# Patient Record
Sex: Male | Born: 1990 | Race: White | Hispanic: No | Marital: Single | Smoking: Never smoker
Health system: Southern US, Community
[De-identification: ages and names within clinical notes are randomized; demographics above are authoritative.]

## PROBLEM LIST (undated history)

## (undated) DIAGNOSIS — J039 Acute tonsillitis, unspecified: Secondary | ICD-10-CM

## (undated) DIAGNOSIS — M25579 Pain in unspecified ankle and joints of unspecified foot: Secondary | ICD-10-CM

## (undated) DIAGNOSIS — G43909 Migraine, unspecified, not intractable, without status migrainosus: Secondary | ICD-10-CM

## (undated) HISTORY — DX: Acute tonsillitis, unspecified: J03.90

## (undated) HISTORY — DX: Migraine, unspecified, not intractable, without status migrainosus: G43.909

## (undated) HISTORY — DX: Pain in unspecified ankle and joints of unspecified foot: M25.579

---

## 2011-12-14 ENCOUNTER — Ambulatory Visit: Payer: Self-pay | Admitting: Family Medicine

## 2013-06-03 IMAGING — CR RIGHT ANKLE - COMPLETE 3+ VIEW
1 series · 5 of 5 positions shown · non-contrast
Comparison: none

REASON FOR EXAM: right ankle pain
COMMENTS:

PROCEDURE:     KDR - KDXR ANKLE RIGHT COMPLETE  - December 14, 2011  [DATE]
RESULT:     No fracture, dislocation or other acute bony abnormality is
identified. The ankle mortise is well maintained.

[Series 1: ap · 0.17mm/px · 5 of 5 slices shown]
[im 1/5]
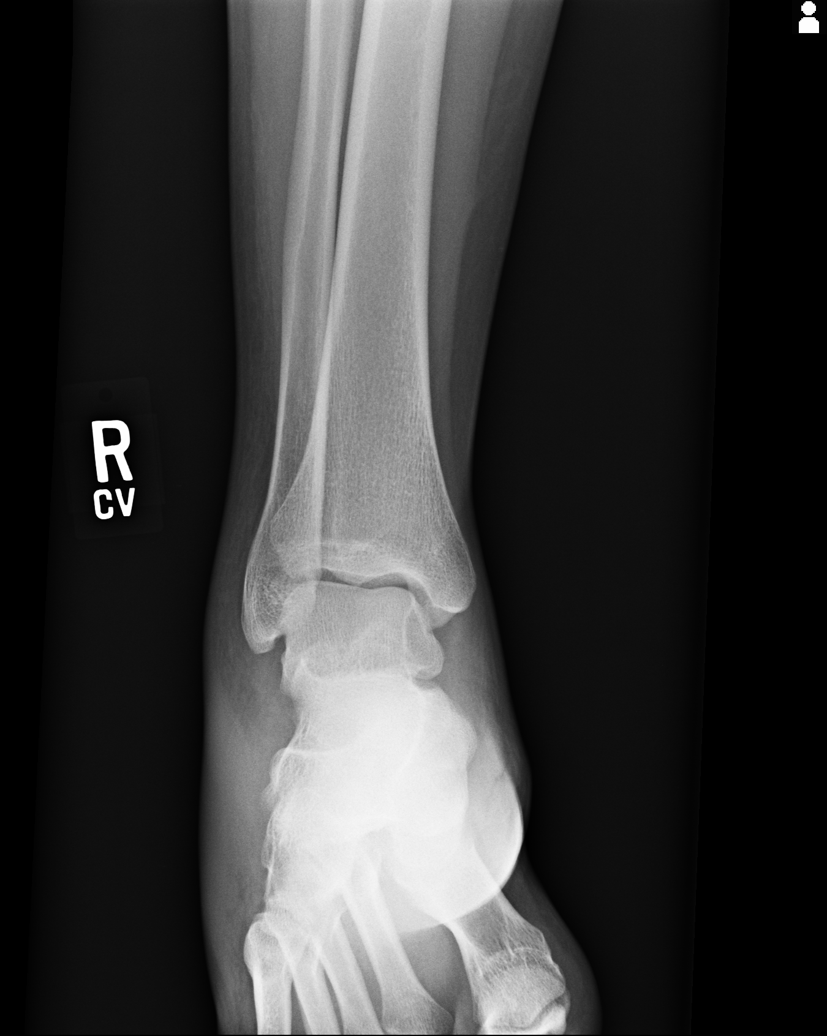
[im 2/5]
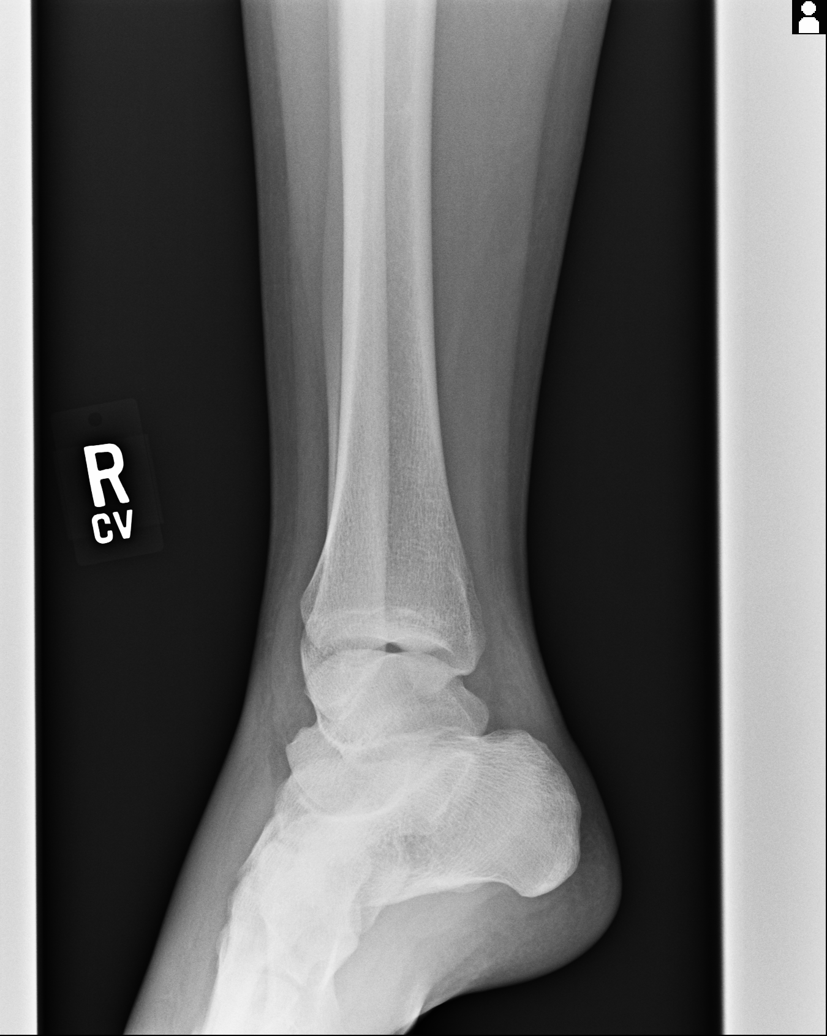
[im 3/5]
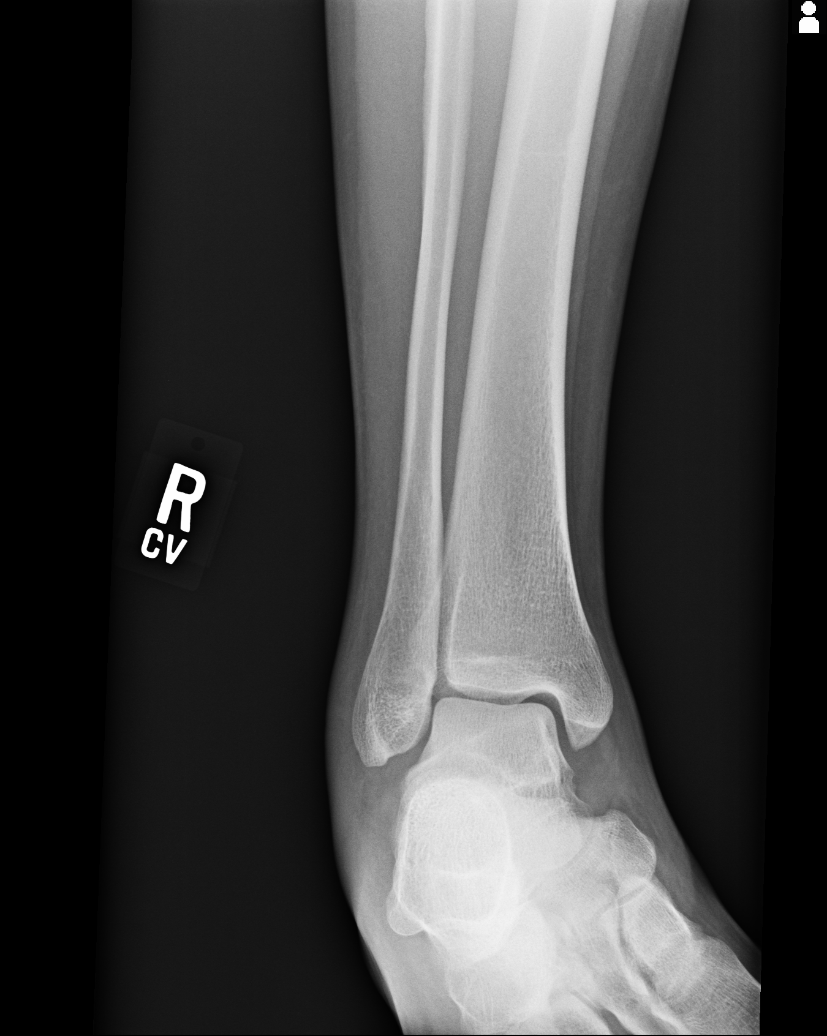
[im 4/5]
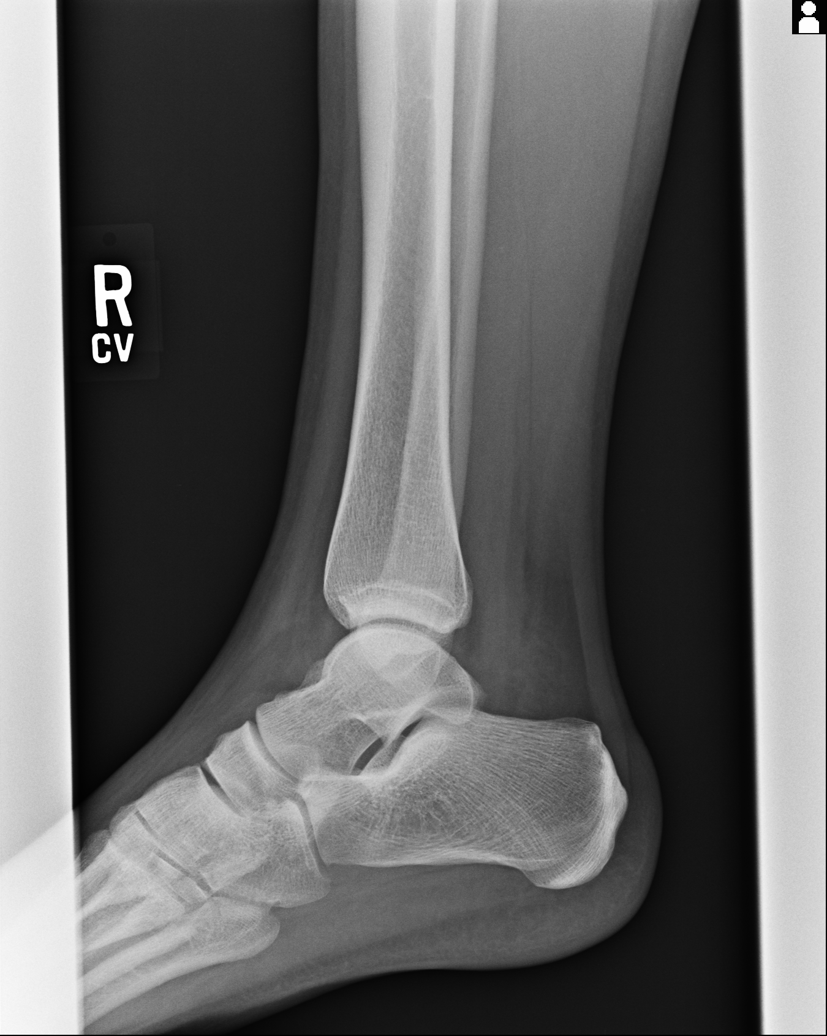
[im 5/5]
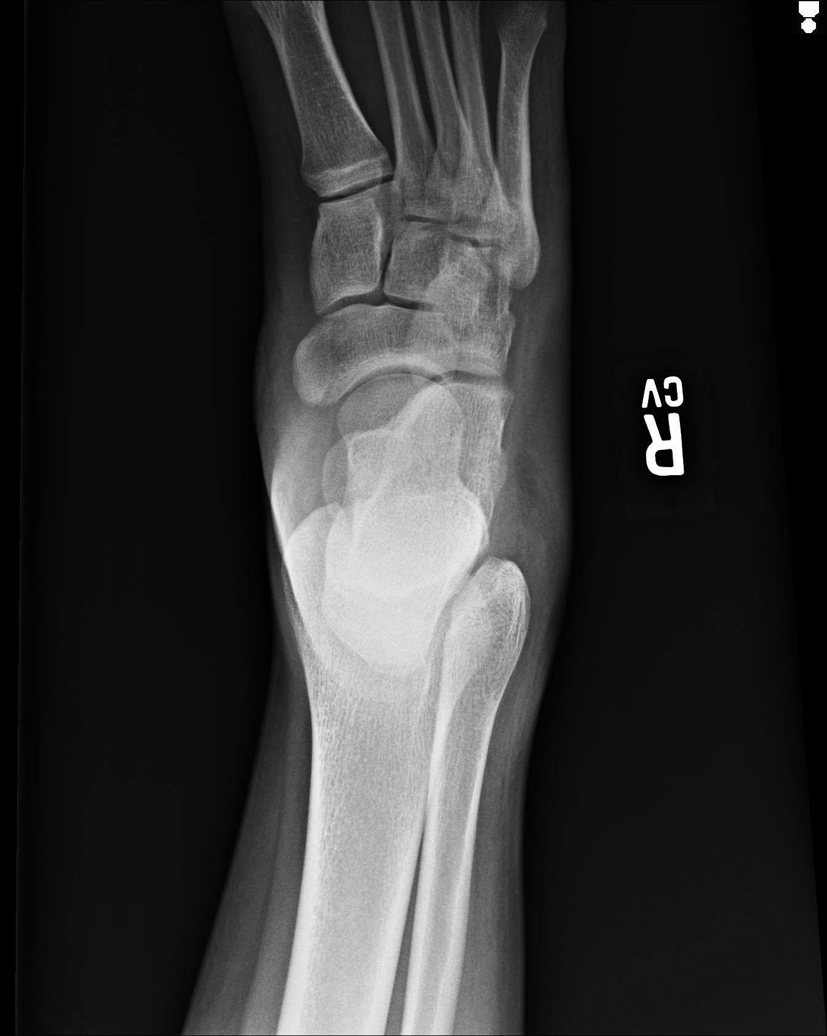

[5 of 5 positions shown; findings below may reference images not displayed]

IMPRESSION: No significant abnormalities are noted.

## 2014-05-18 ENCOUNTER — Emergency Department: Payer: Self-pay | Admitting: Internal Medicine

## 2014-05-18 LAB — CBC WITH DIFFERENTIAL/PLATELET
BASOS PCT: 0.3 %
Basophil #: 0 10*3/uL (ref 0.0–0.1)
EOS ABS: 0 10*3/uL (ref 0.0–0.7)
Eosinophil %: 0.5 %
HCT: 43.2 % (ref 40.0–52.0)
HGB: 14.5 g/dL (ref 13.0–18.0)
Lymphocyte #: 1.5 10*3/uL (ref 1.0–3.6)
Lymphocyte %: 17.2 %
MCH: 32.3 pg (ref 26.0–34.0)
MCHC: 33.6 g/dL (ref 32.0–36.0)
MCV: 96 fL (ref 80–100)
Monocyte #: 0.9 x10 3/mm (ref 0.2–1.0)
Monocyte %: 10.8 %
NEUTROS ABS: 6 10*3/uL (ref 1.4–6.5)
Neutrophil %: 71.2 %
Platelet: 222 10*3/uL (ref 150–440)
RBC: 4.49 10*6/uL (ref 4.40–5.90)
RDW: 11.6 % (ref 11.5–14.5)
WBC: 8.4 10*3/uL (ref 3.8–10.6)

## 2014-05-18 LAB — MONONUCLEOSIS SCREEN: MONO TEST: NEGATIVE

## 2014-05-21 LAB — BETA STREP CULTURE(ARMC)

## 2015-03-09 ENCOUNTER — Encounter: Payer: Self-pay | Admitting: Family Medicine

## 2015-03-09 ENCOUNTER — Ambulatory Visit (INDEPENDENT_AMBULATORY_CARE_PROVIDER_SITE_OTHER): Payer: 59 | Admitting: Family Medicine

## 2015-03-09 VITALS — BP 110/64 | HR 63 | Temp 97.8°F | Resp 16 | Ht 77.0 in | Wt 221.4 lb

## 2015-03-09 DIAGNOSIS — L259 Unspecified contact dermatitis, unspecified cause: Secondary | ICD-10-CM | POA: Diagnosis not present

## 2015-03-09 MED ORDER — METHYLPREDNISOLONE ACETATE 80 MG/ML IJ SUSP: 80.0000 mg | Freq: Once | INTRAMUSCULAR | Status: DC

## 2015-03-09 MED ORDER — METHYLPREDNISOLONE ACETATE 80 MG/ML IJ SUSP
80.0000 mg | Freq: Once | INTRAMUSCULAR | Status: AC
  Administered 2015-03-09: 80 mg via INTRAMUSCULAR

## 2015-03-09 MED ORDER — PREDNISONE 20 MG PO TABS: ORAL_TABLET | ORAL | Status: DC

## 2015-03-09 NOTE — Progress Notes (Signed)
Subjective:     Patient ID: Mark Daniels, male   DOB: 17-Mar-1991, 24 y.o.   MRN: [id number]  HPI  Chief Complaint  Patient presents with  . Rash    patient comes in office today with concerns of having poison oak on upper extremity and spreading to lower extremity. 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   Has been applying Caladryl. Now has facial/ear involvement.   Review of Systems  Constitutional: Negative for fever and chills.       Objective:   Physical Exam  Constitutional: He appears well-developed and well-nourished. No distress.  Skin:  Patches of rash with small overlying vesicles on his bilateral face, ear, and stomach. Linear distribution noted on his left upper arm       Assessment:     1. Contact dermatitis - predniSONE (DELTASONE) 20 MG tablet; Taper as follows: 3 pills x 4 days, 2 pills for 4 days, one pill for four days  Dispense: 24 tablet; Refill: 0    Plan:    Depomedrol 80 mg. IM

## 2015-03-09 NOTE — Patient Instructions (Signed)
Cleanse with soap and water daily 

## 2015-05-14 ENCOUNTER — Ambulatory Visit (INDEPENDENT_AMBULATORY_CARE_PROVIDER_SITE_OTHER): Payer: 59 | Admitting: Family Medicine

## 2015-05-14 ENCOUNTER — Encounter: Payer: Self-pay | Admitting: Family Medicine

## 2015-05-14 VITALS — BP 104/58 | HR 86 | Temp 98.3°F | Resp 16 | Wt 230.4 lb

## 2015-05-14 DIAGNOSIS — J039 Acute tonsillitis, unspecified: Secondary | ICD-10-CM | POA: Insufficient documentation

## 2015-05-14 DIAGNOSIS — G43909 Migraine, unspecified, not intractable, without status migrainosus: Secondary | ICD-10-CM | POA: Insufficient documentation

## 2015-05-14 DIAGNOSIS — S39012A Strain of muscle, fascia and tendon of lower back, initial encounter: Secondary | ICD-10-CM

## 2015-05-14 DIAGNOSIS — M25579 Pain in unspecified ankle and joints of unspecified foot: Secondary | ICD-10-CM | POA: Insufficient documentation

## 2015-05-14 MED ORDER — CYCLOBENZAPRINE HCL 5 MG PO TABS: 5.0000 mg | ORAL_TABLET | Freq: Three times a day (TID) | ORAL | Status: DC | PRN

## 2015-05-14 MED ORDER — HYDROCODONE-ACETAMINOPHEN 5-325 MG PO TABS: ORAL_TABLET | ORAL | Status: DC

## 2015-05-14 NOTE — Patient Instructions (Signed)
Discussed use of ibuprofen 400 to 800 mg. 3 x day with food. 

## 2015-05-14 NOTE — Progress Notes (Signed)
Subjective:     Patient ID: Mark Daniels, male   DOB: [birth year], 24 y.o.   MRN: [id number]  HPI  Chief Complaint  Patient presents with  . Back Pain    lower back pain, started at the bennining of July. Saw a chiropracter twice two weeks ago. have been taking ibuprofen for pain.  States he originally felt back pain when he was getting off the couch. 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 He had to take time off work with some improvement using an occasional nsaid for relief. Now back pain and spasms have flared. He has had two chiropractic treatments with minimal improvement. Denies radicular sx or prior back surgery. He is accompanied by his wife today.   Review of Systems  Constitutional: Negative for fever and chills.       Objective:   Physical Exam  Musculoskeletal:  Muscle strength in lower extremities 5/5. SLR's to 80 degrees without radiation of back pain. Localizes to his bilateral lumbar back. Wearing an ice pack with minimal tenderness to touch.       Assessment:    1. Low back strain, initial encounter - cyclobenzaprine (FLEXERIL) 5 MG tablet; Take 1 tablet (5 mg total) by mouth 3 (three) times daily as needed for muscle spasms.  Dispense: 21 tablet; Refill: 1 - HYDROcodone-acetaminophen (NORCO/VICODIN) 5-325 MG per tablet; One every 4-6 hours as needed for pain  Dispense: 28 tablet; Refill: 0    Plan:    Discussed scheduling nsaid's. Work excuse for 8/25-9/1.

## 2016-03-04 ENCOUNTER — Encounter: Payer: Self-pay | Admitting: Physician Assistant

## 2016-03-04 ENCOUNTER — Ambulatory Visit (INDEPENDENT_AMBULATORY_CARE_PROVIDER_SITE_OTHER): Payer: Self-pay | Admitting: Physician Assistant

## 2016-03-04 VITALS — BP 120/68 | HR 76 | Temp 98.1°F | Resp 16 | Wt 215.0 lb

## 2016-03-04 DIAGNOSIS — L237 Allergic contact dermatitis due to plants, except food: Secondary | ICD-10-CM

## 2016-03-04 MED ORDER — TRIAMCINOLONE ACETONIDE 0.1 % EX CREA: 1.0000 "application " | TOPICAL_CREAM | Freq: Two times a day (BID) | CUTANEOUS | Status: DC

## 2016-03-04 MED ORDER — PREDNISONE 10 MG (21) PO TBPK: ORAL_TABLET | ORAL | Status: DC

## 2016-03-04 NOTE — Progress Notes (Signed)
       Patient: Mark Daniels Male    DOB: Nov 22, 1990   25 y.o.   MRN: [id number] Visit Date: 03/04/2016  Today's Provider: Margaretann LovelessJennifer M Avrie Kedzierski, PA-C   Chief Complaint  Patient presents with  . Rash   Subjective:    Rash This is a new problem. The current episode started in the past 7 days (On Monday). The problem has been gradually worsening since onset. The rash is diffuse. The rash is characterized by blistering, redness, itchiness and burning. He was exposed to plant contact (works in [employer] and all workers got into it). Pertinent negatives include no congestion, cough, diarrhea, eye pain, facial edema, fatigue, fever, joint pain, shortness of breath or sore throat. Past treatments include anti-itch cream (Calamine). The treatment provided no relief.       No Known Allergies No outpatient prescriptions have been marked as taking for the 03/04/16 encounter (Office Visit) with Margaretann LovelessJennifer M Zina Pitzer, PA-C.    Review of Systems  Constitutional: Negative for fever and fatigue.  HENT: Negative for congestion and sore throat.   Eyes: Negative for pain.  Respiratory: Negative for cough and shortness of breath.   Cardiovascular: Negative for chest pain, palpitations and leg swelling.  Gastrointestinal: Negative for diarrhea.  Musculoskeletal: Negative for joint pain.  Skin: Positive for rash.    Social History  Substance Use Topics  . Smoking status: Never Smoker   . Smokeless tobacco: Former NeurosurgeonUser    Types: Chew    Quit date: 02/17/2014  . Alcohol Use: No   Objective:   BP 120/68 mmHg  Pulse 76  Temp(Src) 98.1 F (36.7 C) (Oral)  Resp 16  Wt 215 lb (97.523 kg)  Physical Exam  Constitutional: He appears well-developed and well-nourished. No distress.  HENT:  Head: Normocephalic and atraumatic.  Neck: Normal range of motion. Neck supple.  Cardiovascular: Normal rate, regular rhythm and normal heart sounds.  Exam reveals no gallop and no friction rub.   No murmur  heard. Pulmonary/Chest: Effort normal and breath sounds normal. No respiratory distress. He has no wheezes. He has no rales.  Lymphadenopathy:    He has no cervical adenopathy.  Skin: Rash noted. Rash is vesicular. He is not diaphoretic.  Diffuse vesicular rash that is pruritic. Not currently draining.   Vitals reviewed.       Assessment & Plan:     1. Poison oak dermatitis Will treat with 6-day prednisone taper and triamcinolone. May continue calamine lotion as well. Call if symptoms fail to improve.  - predniSONE (STERAPRED UNI-PAK 21 TAB) 10 MG (21) TBPK tablet; Take as directed on package instructions  Dispense: 21 tablet; Refill: 0 - triamcinolone cream (KENALOG) 0.1 %; Apply 1 application topically 2 (two) times daily.  Dispense: 30 g; Refill: 0       Margaretann LovelessJennifer M Colan Laymon, PA-C  [facility]

## 2016-03-04 NOTE — Patient Instructions (Signed)
Poison Oak Poison oak is an inflammation of the skin (contact dermatitis). It is caused by contact with the allergens on the leaves of the oak (toxicodendron) plants. Depending on your sensitivity, the rash may consist simply of redness and itching, or it may also progress to blisters which may break open (rupture). These must be well cared for to prevent secondary germ (bacterial) infection as these infections can lead to scarring. The eyes may also get puffy. The puffiness is worst in the morning and gets better as the day progresses. Healing is best accomplished by keeping any open areas dry, clean, covered with a bandage, and covered with an antibacterial ointment if needed. Without secondary infection, this dermatitis usually heals without scarring within 2 to 3 weeks without treatment. HOME CARE INSTRUCTIONS When you have been exposed to poison oak, it is very important to thoroughly wash with soap and water as soon as the exposure has been discovered. You have about one half hour to remove the plant resin before it will cause the rash. This cleaning will quickly destroy the oil or antigen on the skin (the antigen is what causes the rash). Wash aggressively under the fingernails as any plant resin still there will continue to spread the rash. Do not rub skin vigorously when washing affected area. Poison oak cannot spread if no oil from the plant remains on your body. Rash that has progressed to weeping sores (lesions) will not spread the rash unless you have not washed thoroughly. It is also important to clean any clothes you have been wearing as they may carry active allergens which will spread the rash, even several days later. Avoidance of the plant in the future is the best measure. Poison oak plants can be recognized by the number of leaves. Generally, poison oak has three leaves with flowering branches on a single stem. Diphenhydramine may be purchased over the counter and used as needed for  itching. Do not drive with this medication if it makes you drowsy. Ask your caregiver about medication for children. SEEK IMMEDIATE MEDICAL CARE IF:   Open areas of the rash develop.  You notice redness extending beyond the area of the rash.  There is a pus like discharge.  There is increased pain.  Other signs of infection develop (such as fever).   This information is not intended to replace advice given to you by your health care provider. Make sure you discuss any questions you have with your health care provider.   Document Released: 03/12/2003 Document Revised: 11/28/2011 Document Reviewed: 02/11/2015 Elsevier Interactive Patient Education 2016 Elsevier Inc.  

## 2019-08-28 ENCOUNTER — Other Ambulatory Visit: Payer: Self-pay | Admitting: *Deleted

## 2019-08-28 DIAGNOSIS — Z20822 Contact with and (suspected) exposure to covid-19: Secondary | ICD-10-CM

## 2019-08-30 LAB — NOVEL CORONAVIRUS, NAA: SARS-CoV-2, NAA: NOT DETECTED

## 2020-04-23 ENCOUNTER — Ambulatory Visit: Payer: Self-pay

## 2020-04-23 ENCOUNTER — Other Ambulatory Visit: Payer: Self-pay

## 2020-04-23 DIAGNOSIS — Z0000 Encounter for general adult medical examination without abnormal findings: Secondary | ICD-10-CM

## 2020-04-23 NOTE — Addendum Note (Signed)
Addended by: Christianne Dolin F on: 04/23/2020 09:16 AM   Modules accepted: Orders

## 2020-04-24 LAB — CMP12+LP+TP+TSH+6AC+CBC/D/PLT
ALT: 19 IU/L (ref 0–44)
AST: 20 IU/L (ref 0–40)
Albumin/Globulin Ratio: 2.5 — ABNORMAL HIGH (ref 1.2–2.2)
Albumin: 4.7 g/dL (ref 4.1–5.2)
Alkaline Phosphatase: 49 IU/L (ref 48–121)
BUN/Creatinine Ratio: 13 (ref 9–20)
BUN: 13 mg/dL (ref 6–20)
Basophils Absolute: 0 10*3/uL (ref 0.0–0.2)
Basos: 1 %
Bilirubin Total: 1 mg/dL (ref 0.0–1.2)
Calcium: 9.2 mg/dL (ref 8.7–10.2)
Chloride: 102 mmol/L (ref 96–106)
Chol/HDL Ratio: 2.4 ratio (ref 0.0–5.0)
Cholesterol, Total: 120 mg/dL (ref 100–199)
Creatinine, Ser: 0.98 mg/dL (ref 0.76–1.27)
EOS (ABSOLUTE): 0.1 10*3/uL (ref 0.0–0.4)
Eos: 3 %
Estimated CHD Risk: <0.5 times avg. (ref 0.0–1.0)
Free Thyroxine Index: 2.5 (ref 1.2–4.9)
GFR calc Af Amer: 122 mL/min/{1.73_m2} (ref >59)
GFR calc non Af Amer: 105 mL/min/{1.73_m2} (ref >59)
GGT: 14 IU/L (ref 0–65)
Globulin, Total: 1.9 g/dL (ref 1.5–4.5)
Glucose: 92 mg/dL (ref 65–99)
HDL: 49 mg/dL (ref >39)
Hematocrit: 42.8 % (ref 37.5–51.0)
Hemoglobin: 14.9 g/dL (ref 13.0–17.7)
Immature Grans (Abs): 0 10*3/uL (ref 0.0–0.1)
Immature Granulocytes: 1 %
Iron: 94 ug/dL (ref 38–169)
LDH: 170 IU/L (ref 121–224)
LDL Chol Calc (NIH): 61 mg/dL (ref 0–99)
Lymphocytes Absolute: 1.7 10*3/uL (ref 0.7–3.1)
Lymphs: 36 %
MCH: 32.4 pg (ref 26.6–33.0)
MCHC: 34.8 g/dL (ref 31.5–35.7)
MCV: 93 fL (ref 79–97)
Monocytes Absolute: 0.4 10*3/uL (ref 0.1–0.9)
Monocytes: 8 %
Neutrophils Absolute: 2.4 10*3/uL (ref 1.4–7.0)
Neutrophils: 51 %
Phosphorus: 2.8 mg/dL (ref 2.8–4.1)
Platelets: 287 10*3/uL (ref 150–450)
Potassium: 3.6 mmol/L (ref 3.5–5.2)
RBC: 4.6 x10E6/uL (ref 4.14–5.80)
RDW: 12 % (ref 11.6–15.4)
Sodium: 139 mmol/L (ref 134–144)
T3 Uptake Ratio: 33 % (ref 24–39)
T4, Total: 7.6 ug/dL (ref 4.5–12.0)
TSH: 0.981 u[IU]/mL (ref 0.450–4.500)
Total Protein: 6.6 g/dL (ref 6.0–8.5)
Triglycerides: 40 mg/dL (ref 0–149)
Uric Acid: 6.7 mg/dL (ref 3.8–8.4)
VLDL Cholesterol Cal: 10 mg/dL (ref 5–40)
WBC: 4.6 10*3/uL (ref 3.4–10.8)

## 2020-04-30 ENCOUNTER — Ambulatory Visit: Payer: Self-pay | Admitting: Emergency Medicine

## 2020-04-30 ENCOUNTER — Other Ambulatory Visit: Payer: Self-pay

## 2020-04-30 VITALS — BP 134/68 | HR 81 | Temp 98.8°F

## 2020-04-30 DIAGNOSIS — Z0000 Encounter for general adult medical examination without abnormal findings: Secondary | ICD-10-CM

## 2020-04-30 LAB — POCT URINALYSIS DIPSTICK
Bilirubin, UA: NEGATIVE
Blood, UA: NEGATIVE
Glucose, UA: NEGATIVE
Ketones, UA: NEGATIVE
Leukocytes, UA: NEGATIVE
Nitrite, UA: NEGATIVE
Protein, UA: NEGATIVE
Spec Grav, UA: 1.01 (ref 1.010–1.025)
Urobilinogen, UA: 0.2 E.U./dL
pH, UA: 7 (ref 5.0–8.0)

## 2020-04-30 NOTE — Addendum Note (Signed)
Addended by: Christianne Dolin F on: 04/30/2020 04:19 PM   Modules accepted: Orders

## 2020-04-30 NOTE — Progress Notes (Signed)
Hearing Screening completed and placed on paper chart. Occupational Health Provider Note       Time seen: 11:19 AM    I have reviewed the vital signs and the nursing notes.  HISTORY   Chief Complaint No chief complaint on file.    HPI Mark Daniels is a 29 y.o. male with a history of migraines, tonsillitis who presents today for annual physical examination.  Patient denies any specific complaints, occasionally has some low back pain but not consistently.  Past Medical History:  Diagnosis Date  . Ankle pain right  . Migraine   . Tonsillitis     No past surgical history on file.  Allergies Patient has no known allergies.   Review of Systems Constitutional: Negative for fever. Cardiovascular: Negative for chest pain. Respiratory: Negative for shortness of breath. Gastrointestinal: Negative for abdominal pain, vomiting and diarrhea. Musculoskeletal: Positive occasional low back pain Skin: Negative for rash. Neurological: Negative for headaches, focal weakness or numbness.  All systems negative/normal/unremarkable except as stated in the HPI  ____________________________________________   PHYSICAL EXAM:  VITAL SIGNS: Vitals:   04/30/20 1137  BP: 134/68  Pulse: 81  Temp: 98.8 F (37.1 C)  SpO2: 99%    Constitutional: Alert and oriented. Well appearing and in no distress. Eyes: Conjunctivae are normal. Normal extraocular movements. ENT      Head: Normocephalic and atraumatic.      Nose: No congestion/rhinnorhea.      Mouth/Throat: Mucous membranes are moist.      Neck: No stridor. Cardiovascular: Normal rate, regular rhythm. No murmurs, rubs, or gallops. Respiratory: Normal respiratory effort without tachypnea nor retractions. Breath sounds are clear and equal bilaterally. No wheezes/rales/rhonchi. Gastrointestinal: Soft and nontender. Normal bowel sounds Musculoskeletal: Nontender with normal range of motion in extremities. No lower extremity  tenderness nor edema. Neurologic:  Normal speech and language. No gross focal neurologic deficits are appreciated.  Skin:  Skin is warm, dry and intact. No rash noted. Psychiatric: Speech and behavior are normal.   ____________________________________________   LABS (pertinent positives/negatives)  Recent Results (from the past 2160 hour(s))  CMP12+LP+TP+TSH+6AC+CBC/D/Plt     Status: Abnormal   Collection Time: 04/23/20  9:00 AM  Result Value Ref Range   Glucose 92 65 - 99 mg/dL   Uric Acid 6.7 3.8 - 8.4 mg/dL    Comment:            Therapeutic target for gout patients: <6.0   BUN 13 6 - 20 mg/dL   Creatinine, Ser 0.98 0.76 - 1.27 mg/dL   GFR calc non Af Amer 105 >59 mL/min/1.73   GFR calc Af Amer 122 >59 mL/min/1.73    Comment: **[facility] currently reports eGFR in compliance with the current**   recommendations of the [employer]. [facility] will   update reporting as new guidelines are published from the NKF-ASN   Task force.    BUN/Creatinine Ratio 13 9 - 20   Sodium 139 134 - 144 mmol/L   Potassium 3.6 3.5 - 5.2 mmol/L   Chloride 102 96 - 106 mmol/L   Calcium 9.2 8.7 - 10.2 mg/dL   Phosphorus 2.8 2.8 - 4.1 mg/dL   Total Protein 6.6 6.0 - 8.5 g/dL   Albumin 4.7 4.1 - 5.2 g/dL   Globulin, Total 1.9 1.5 - 4.5 g/dL   Albumin/Globulin Ratio 2.5 (H) 1.2 - 2.2   Bilirubin Total 1.0 0.0 - 1.2 mg/dL   Alkaline Phosphatase 49 48 - 121 IU/L   LDH 170  121 - 224 IU/L   AST 20 0 - 40 IU/L   ALT 19 0 - 44 IU/L   GGT 14 0 - 65 IU/L   Iron 94 38 - 169 ug/dL   Cholesterol, Total 120 100 - 199 mg/dL   Triglycerides 40 0 - 149 mg/dL   HDL 49 >39 mg/dL   VLDL Cholesterol Cal 10 5 - 40 mg/dL   LDL Chol Calc (NIH) 61 0 - 99 mg/dL   Chol/HDL Ratio 2.4 0.0 - 5.0 ratio    Comment:                                   T. Chol/HDL Ratio                                             Men  Women                               1/2 Avg.Risk  3.4    3.3                                    Avg.Risk  5.0    4.4                                2X Avg.Risk  9.6    7.1                                3X Avg.Risk 23.4   11.0    Estimated CHD Risk  < 0.5 0.0 - 1.0 times avg.    Comment: The CHD Risk is based on the T. Chol/HDL ratio. Other factors affect CHD Risk such as hypertension, smoking, diabetes, severe obesity, and family history of premature CHD.    TSH 0.981 0.450 - 4.500 uIU/mL   T4, Total 7.6 4.5 - 12.0 ug/dL   T3 Uptake Ratio 33 24 - 39 %   Free Thyroxine Index 2.5 1.2 - 4.9   WBC 4.6 3.4 - 10.8 x10E3/uL   RBC 4.60 4.14 - 5.80 x10E6/uL   Hemoglobin 14.9 13.0 - 17.7 g/dL   Hematocrit 42.8 37.5 - 51.0 %   MCV 93 79 - 97 fL   MCH 32.4 26.6 - 33.0 pg   MCHC 34.8 31 - 35 g/dL   RDW 12.0 11.6 - 15.4 %   Platelets 287 150 - 450 x10E3/uL   Neutrophils 51 Not Estab. %   Lymphs 36 Not Estab. %   Monocytes 8 Not Estab. %   Eos 3 Not Estab. %   Basos 1 Not Estab. %   Neutrophils Absolute 2.4 1 - 7 x10E3/uL   Lymphocytes Absolute 1.7 0 - 3 x10E3/uL   Monocytes Absolute 0.4 0 - 0 x10E3/uL   EOS (ABSOLUTE) 0.1 0.0 - 0.4 x10E3/uL   Basophils Absolute 0.0 0 - 0 x10E3/uL   Immature Granulocytes 1 Not Estab. %   Immature Grans (Abs) 0.0 0.0 - 0.1 x10E3/uL     ASSESSMENT AND PLAN  Annual physical examination   Plan:  The patient had presented for an annual physical examination. Patient's labs were reassuring.  He is cleared follow-up as needed  Lenise Arena MD    Note: This note was generated in part or whole with voice recognition software. Voice recognition is usually quite accurate but there are transcription errors that can and very often do occur. I apologize for any typographical errors that were not detected and corrected.

## 2020-04-30 NOTE — Addendum Note (Signed)
Addended by: Oda Cogan on: 04/30/2020 01:14 PM   Modules accepted: Orders

## 2020-06-30 ENCOUNTER — Other Ambulatory Visit: Payer: Self-pay

## 2020-09-22 ENCOUNTER — Other Ambulatory Visit: Payer: Self-pay

## 2020-09-22 ENCOUNTER — Ambulatory Visit: Payer: Self-pay

## 2020-09-22 DIAGNOSIS — Z0000 Encounter for general adult medical examination without abnormal findings: Secondary | ICD-10-CM

## 2020-09-22 LAB — POCT URINALYSIS DIPSTICK
Bilirubin, UA: NEGATIVE
Blood, UA: NEGATIVE
Glucose, UA: NEGATIVE
Ketones, UA: NEGATIVE
Leukocytes, UA: NEGATIVE
Nitrite, UA: NEGATIVE
Protein, UA: NEGATIVE
Spec Grav, UA: <=1.005 — AB (ref 1.010–1.025)
Urobilinogen, UA: 0.2 E.U./dL
pH, UA: 6 (ref 5.0–8.0)

## 2020-09-22 NOTE — Progress Notes (Signed)
Pt scheduled to complete physical with Durward Parcel, PA-C 09/29/20

## 2020-09-23 LAB — CMP12+LP+TP+TSH+6AC+CBC/D/PLT
ALT: 10 IU/L (ref 0–44)
AST: 14 IU/L (ref 0–40)
Albumin/Globulin Ratio: 2.3 — ABNORMAL HIGH (ref 1.2–2.2)
Albumin: 5 g/dL (ref 4.1–5.2)
Alkaline Phosphatase: 54 IU/L (ref 44–121)
BUN/Creatinine Ratio: 15 (ref 9–20)
BUN: 15 mg/dL (ref 6–20)
Basophils Absolute: 0 10*3/uL (ref 0.0–0.2)
Basos: 1 %
Bilirubin Total: 0.4 mg/dL (ref 0.0–1.2)
Calcium: 9.4 mg/dL (ref 8.7–10.2)
Chloride: 101 mmol/L (ref 96–106)
Chol/HDL Ratio: 2.9 ratio (ref 0.0–5.0)
Cholesterol, Total: 139 mg/dL (ref 100–199)
Creatinine, Ser: 1 mg/dL (ref 0.76–1.27)
EOS (ABSOLUTE): 0.2 10*3/uL (ref 0.0–0.4)
Eos: 3 %
Estimated CHD Risk: <0.5 times avg. (ref 0.0–1.0)
Free Thyroxine Index: 2.1 (ref 1.2–4.9)
GFR calc Af Amer: 118 mL/min/{1.73_m2} (ref >59)
GFR calc non Af Amer: 102 mL/min/{1.73_m2} (ref >59)
GGT: 12 IU/L (ref 0–65)
Globulin, Total: 2.2 g/dL (ref 1.5–4.5)
Glucose: 78 mg/dL (ref 65–99)
HDL: 48 mg/dL (ref >39)
Hematocrit: 46.3 % (ref 37.5–51.0)
Hemoglobin: 16.2 g/dL (ref 13.0–17.7)
Immature Grans (Abs): 0.1 10*3/uL (ref 0.0–0.1)
Immature Granulocytes: 1 %
Iron: 66 ug/dL (ref 38–169)
LDH: 180 IU/L (ref 121–224)
LDL Chol Calc (NIH): 78 mg/dL (ref 0–99)
Lymphocytes Absolute: 2.6 10*3/uL (ref 0.7–3.1)
Lymphs: 42 %
MCH: 32.9 pg (ref 26.6–33.0)
MCHC: 35 g/dL (ref 31.5–35.7)
MCV: 94 fL (ref 79–97)
Monocytes Absolute: 0.4 10*3/uL (ref 0.1–0.9)
Monocytes: 7 %
Neutrophils Absolute: 2.9 10*3/uL (ref 1.4–7.0)
Neutrophils: 46 %
Phosphorus: 3 mg/dL (ref 2.8–4.1)
Platelets: 303 10*3/uL (ref 150–450)
Potassium: 3.7 mmol/L (ref 3.5–5.2)
RBC: 4.92 x10E6/uL (ref 4.14–5.80)
RDW: 11 % — ABNORMAL LOW (ref 11.6–15.4)
Sodium: 140 mmol/L (ref 134–144)
T3 Uptake Ratio: 29 % (ref 24–39)
T4, Total: 7.4 ug/dL (ref 4.5–12.0)
TSH: 1.63 u[IU]/mL (ref 0.450–4.500)
Total Protein: 7.2 g/dL (ref 6.0–8.5)
Triglycerides: 66 mg/dL (ref 0–149)
Uric Acid: 5.3 mg/dL (ref 3.8–8.4)
VLDL Cholesterol Cal: 13 mg/dL (ref 5–40)
WBC: 6.2 10*3/uL (ref 3.4–10.8)

## 2020-09-29 ENCOUNTER — Encounter: Payer: 59 | Admitting: Physician Assistant

## 2020-09-30 ENCOUNTER — Other Ambulatory Visit: Payer: Self-pay

## 2020-09-30 ENCOUNTER — Encounter: Payer: Self-pay | Admitting: Adult Medicine

## 2020-09-30 ENCOUNTER — Ambulatory Visit: Payer: Self-pay | Admitting: Adult Medicine

## 2020-09-30 VITALS — BP 125/68 | HR 76 | Resp 12 | Ht 77.0 in | Wt 187.0 lb

## 2020-09-30 DIAGNOSIS — Z0000 Encounter for general adult medical examination without abnormal findings: Secondary | ICD-10-CM

## 2020-09-30 DIAGNOSIS — G43009 Migraine without aura, not intractable, without status migrainosus: Secondary | ICD-10-CM

## 2020-09-30 NOTE — Progress Notes (Signed)
Subjective:    Patient ID: Mark Daniels, male    DOB: 1991/08/14, 30 y.o.   MRN: [id number]  HPI HPI Mark Daniels is a 30 y.o. male with a history of migraines, tonsillitis who presents today for annual physical examination.  Patient denies any specific complaints  Reports HA present since middle school  in last 5y occurs 1x q3-77massoc with nausea, photophbia and last 3hr with post HA effect up to24hr   Allergy +/- reaction to dust, pine needles  Denies these as temporally related triggers  Med  NSAID  Flexeril  Review of Systems  Noncontributory and unchanged since last annual exam     Objective:   Physical Exam  WF WM NAD  A&Ox3 nl affect HEENT  [place]/ AT  PERRLA with undilated funduscopic exam, Mouth Nose wnl Thyroid non palpable Pulm Clear to A&P Cardiac PMI nl NSR no murmer, ectopy Abd soft BS + no organ enlargement Extremity dominant rt hand, fROM, 5/5strength upper/lower  Neuro intact with antalgia  Component Ref Range & Units 8 d ago 5 mo ago 6 yr ago  Glucose 65 - 99 mg/dL 78  92    Uric Acid 3.8 - 8.4 mg/dL 5.3  6.7 CM    Comment:      Therapeutic target for gout patients: <6.0  BUN 6 - 20 mg/dL 15  13    Creatinine, Ser 0.76 - 1.27 mg/dL 1.00  0.98    GFR calc non Af Amer >59 mL/min/1.73 102  105    GFR calc Af Amer >59 mL/min/1.73 118  122 CM    Comment: **In accordance with recommendations from the NKF-ASN Task force,**   [facility] is in the process of updating its eGFR calculation to the   2021 CKD-EPI creatinine equation that estimates kidney function   without a race variable.   BUN/Creatinine Ratio 9 - _0 Sodium 134 - 144 mmol/L 140  139    Potassium 3.5 - 5.2 mmol/L 3.7  3.6    Chloride 96 - 106 mmol/L 101  102    Calcium 8.7 - 10.2 mg/dL 9.4  9.2    Phosphorus 2.8 - 4.1 mg/dL 3.0  2.8    Total Protein 6.0 - 8.5 g/dL 7.2  6.6    Albumin 4.1 - 5.2 g/dL 5.0  4.7    Globulin, Total 1.5 - 4.5 g/dL 2.2  1.9     Albumin/Globulin Ratio 1.2 - 2.2 2.3High  2.5High    Bilirubin Total 0.0 - 1.2 mg/dL 0.4  1.0    Alkaline Phosphatase 44 - 121 IU/L 54  49 R    Comment:       **Please note reference interval change**  LDH 121 - 224 IU/L 180  170    AST 0 - 40 IU/L 14  20    ALT 0 - 44 IU/L 10  19    GGT 0 - 65 IU/L 12  14    Iron 38 - 169 ug/dL 66  94    Cholesterol, Total 100 - 199 mg/dL 139  120    Triglycerides 0 - 149 mg/dL 66  40    HDL >39 mg/dL 48  49    VLDL Cholesterol Cal 5 - 40 mg/dL 13  10    LDL Chol Calc (NIH) 0 - 99 mg/dL 78  61    Chol/HDL Ratio 0.0 - 5.0 ratio 2.9  2.4 CM     TSH  0.450 - 4.500 uIU/mL 1.630  0.981    T4, Total 4.5 - 12.0 ug/dL 7.4  7.6    T3 Uptake Ratio 24 - 39 % 29  33    Free Thyroxine Index 1.2 - 4.9 2.1  2.5    WBC 3.4 - 10.8 x10E3/uL 6.2  4.6  8.4 R   RBC 4.14 - 5.80 x10E6/uL 4.92  4.60  4.49 R   Hemoglobin 13.0 - 17.7 g/dL 16.2  14.9    Hematocrit 37.5 - 51.0 % 46.3  42.8    MCV 79 - 97 fL 94  93  96 R   MCH 26.6 - 33.0 pg 32.9  32.4  32.3 R   MCHC 31.5 - 35.7 g/dL 35.0  34.8  33.6 R   RDW 11.6 - 15.4 % 11.0Low  12.0  11.6 R   Platelets 150 - 450 x10E3/uL 303  287  222 R   Neutrophils Not Estab. % 46  51  71.2 R   Lymphs Not Estab. % 42  36    Monocytes Not Estab. % 7  8    Eos Not Estab. % 3  3    Basos Not Estab. % 1  1    Neutrophils Absolute 1.4 - 7.0 x10E3/uL 2.9  2.4  6.0 R   Lymphocytes Absolute 0.7 - 3.1 x10E3/uL 2.6  1.7  1.5 R   Monocytes Absolute 0.1 - 0.9 x10E3/uL 0.4  0.4    EOS (ABSOLUTE) 0.0 - 0.4 x10E3/uL 0.2  0.1    Basophils Absolute 0.0 - 0.2 x10E3/uL 0.0  0.0  0.0 R   Immature Granulocytes Not Estab. % 1  1    Immature Grans (Abs) 0.0 - 0.1 x10E3/uL 0.1  0.0       TSH 0.450 - 4.500 uIU/mL 1.630  0.981    T4, Total 4.5 - 12.0 ug/dL 7.4  7.6    T3 Uptake Ratio 24 - 39 % 29  33    Free Thyroxine Index 1.2 - 4.9 2.1  2.5    WBC 3.4 - 10.8 x10E3/uL 6.2  4.6  8.4 R   RBC 4.14 - 5.80 x10E6/uL 4.92  4.60  4.49 R    Hemoglobin 13.0 - 17.7 g/dL 16.2  14.9    Hematocrit 37.5 - 51.0 % 46.3  42.8    MCV 79 - 97 fL 94  93  96 R   MCH 26.6 - 33.0 pg 32.9  32.4  32.3 R   MCHC 31.5 - 35.7 g/dL 35.0  34.8  33.6 R   RDW 11.6 - 15.4 % 11.0Low  12.0  11.6 R   Platelets 150 - 450 x10E3/uL 303  287  222 R   Neutrophils Not Estab. % 46  51  71.2 R   Lymphs Not Estab. % 42  36    Monocytes Not Estab. % 7  8    Eos Not Estab. % 3  3    Basos Not Estab. % 1  1    Neutrophils Absolute 1.4 - 7.0 x10E3/uL 2.9  2.4  6.0 R   Lymphocytes Absolute 0.7 - 3.1 x10E3/uL 2.6  1.7  1.5 R   Monocytes Absolute 0.1 - 0.9 x10E3/uL 0.4  0.4    EOS (ABSOLUTE) 0.0 - 0.4 x10E3/uL 0.2  0.1    Basophils Absolute 0.0 - 0.2 x10E3/uL 0.0  0.0  0.0 R   Immature Granulocytes Not Estab. % 1  1    Immature Grans (Abs) 0.0 -  0.1 x10E3/uL 0.1  0.0     Component Ref Range & Units 8 d ago 5 mo ago  Color, UA  clear  clear   Clarity, UA  clear  clear   Glucose, UA Negative Negative  Negative   Bilirubin, UA  negative  neg   Ketones, UA  negative  neg   Spec Grav, UA 1.010 - 1.025 <=1.005Abnormal  1.010   Blood, UA  negative  neg   pH, UA 5.0 - 8.0 6.0  7.0   Protein, UA Negative Negative  Negative   Urobilinogen, UA 0.2 or 1.0 E.U./dL 0.2  0.2   Nitrite, UA  negative  neg   Leukocytes, UA Negative Negative  Negative   Appearance  clear  clear   Odor       Component Ref Range & Units 8 d ago 5 mo ago  Color, UA  clear  clear   Clarity, UA  clear  clear   Glucose, UA Negative Negative  Negative   Bilirubin, UA  negative  neg   Ketones, UA  negative  neg   Spec Grav, UA 1.010 - 1.025 <=1.005Abnormal  1.010   Blood, UA  negative  neg   pH, UA 5.0 - 8.0 6.0  7.0   Protein, UA Negative Negative  Negative   Urobilinogen, UA 0.2 or 1.0 E.U./dL 0.2  0.2   Nitrite, UA  negative  neg   Leukocytes, UA Negative Negative  Negative   Appearance  clear  clear   Odor          Assessment & Plan:  Well Annual physical exam Lab testing  discussed and review as normal finding Hx of Migraine reports well controlled on current therapy

## 2021-04-09 ENCOUNTER — Ambulatory Visit: Payer: Self-pay

## 2021-04-12 ENCOUNTER — Other Ambulatory Visit: Payer: Self-pay

## 2021-04-12 DIAGNOSIS — Z20822 Contact with and (suspected) exposure to covid-19: Secondary | ICD-10-CM

## 2021-04-12 NOTE — Progress Notes (Signed)
Presents to [facility] for outdoor specimen collection for covid test.  Wife tested positive for Covid last Thursday. Child tested positive for Covid today.  S/Sx started yesterday: Runny nose Cough Headache  Scratchy throat  S/Sx worse in AM & around bedtime.  Vaccinated - No Booster  Has Mychart  AMD

## 2021-04-13 LAB — SARS-COV-2, NAA 2 DAY TAT

## 2021-04-13 LAB — NOVEL CORONAVIRUS, NAA: SARS-CoV-2, NAA: NOT DETECTED

## 2021-08-23 ENCOUNTER — Other Ambulatory Visit: Payer: Self-pay

## 2021-08-23 ENCOUNTER — Ambulatory Visit: Payer: Self-pay | Admitting: Physician Assistant

## 2021-08-23 ENCOUNTER — Encounter: Payer: Self-pay | Admitting: Physician Assistant

## 2021-08-23 VITALS — BP 147/78 | HR 89 | Temp 99.2°F | Resp 14 | Ht 77.0 in | Wt 170.0 lb

## 2021-08-23 DIAGNOSIS — L03011 Cellulitis of right finger: Secondary | ICD-10-CM

## 2021-08-23 MED ORDER — SULFAMETHOXAZOLE-TRIMETHOPRIM 800-160 MG PO TABS: 1.0000 | ORAL_TABLET | Freq: Two times a day (BID) | ORAL | 0 refills | Status: DC

## 2021-08-23 MED ORDER — OXYCODONE-ACETAMINOPHEN 5-325 MG PO TABS: 1.0000 | ORAL_TABLET | Freq: Four times a day (QID) | ORAL | 0 refills | Status: AC | PRN

## 2021-08-23 NOTE — Progress Notes (Signed)
Right ring finger: Pt pulled hang nail Wednesday of last week. Friday it started swelling and turning red. Pt believes the infection spread to his lymph nodes in arm pit because its sore.

## 2021-08-23 NOTE — Progress Notes (Signed)
   Subjective: Infected finger    Patient ID: Mark Daniels, male    DOB: [birth year], 30 y.o.   MRN: [id number]  HPI Patient presents with pain and edema to the nailbed of the fourth digit right hand.  Patient stated he had a "hangnail" which he removed 4 days ago.  Patient now presents with edema and erythema to the nailbed.  No drainage.   Review of Systems Negative except for chief complaint    Objective:   Physical Exam  No acute distress.  Temperature 99.2, pulse 89, respiration 14, BP is 147/70, patient 90% O2 sat on room air.   Examination of the fourth digit right hand shows edema and erythema at the nailbed of the fourth digit left hand.       Assessment & Plan: Paronychia   Obtain advised consent for patient for incision and drainage since the lesion is fluctuant.  Digital block was obtained using 1% lidocaine without epinephrine.  #11 blade was used to excise the area resulted in mild purulent and bloody drainage.  Areas reclean and surgical dressed.  Patient had a pressure dressing applied.  Patient given discharge care instructions and a prescription for Bactrim DS and 4 tablets of Percocets 5/325's.  Patient vies return back if no improvement or worsening complaint.

## 2021-11-22 ENCOUNTER — Ambulatory Visit: Payer: Self-pay | Admitting: Physician Assistant

## 2021-11-22 ENCOUNTER — Other Ambulatory Visit: Payer: Self-pay

## 2021-11-22 ENCOUNTER — Encounter: Payer: Self-pay | Admitting: Physician Assistant

## 2021-11-22 DIAGNOSIS — J020 Streptococcal pharyngitis: Secondary | ICD-10-CM

## 2021-11-22 LAB — POCT RAPID STREP A (OFFICE): Rapid Strep A Screen: POSITIVE — AB

## 2021-11-22 MED ORDER — AMOXICILLIN 875 MG PO TABS: 875.0000 mg | ORAL_TABLET | Freq: Two times a day (BID) | ORAL | 0 refills | Status: AC

## 2021-11-22 NOTE — Progress Notes (Signed)
Pt presents today with sore throat 1 1/2 weeks, and red dot on uvula. ? ? ?

## 2021-11-22 NOTE — Progress Notes (Signed)
? ?  Subjective: Sore throat  ? ? Patient ID: Mark Daniels, male    DOB: 1991/08/29, 31 y.o.   MRN: [id number] ? ?HPI ?Patient complain of sore throat for several days.  Patient tested negative for COVID-19 and influenza.  Patient test positive for strep pharyngitis. ? ? ?Review of Systems ?Negative except for chief complaint. ?   ?Objective:  ? Physical Exam ?This is a virtual visit. ? ? ? ?   ?Assessment & Plan: Strep pharyngitis.  ? ?Patient given prescription for amoxicillin and may return back to work on 11/24/2021. ?

## 2021-11-22 NOTE — Progress Notes (Signed)
Pt tested positive for strept throat. ?

## 2021-12-06 ENCOUNTER — Ambulatory Visit: Payer: Self-pay

## 2021-12-06 ENCOUNTER — Other Ambulatory Visit: Payer: Self-pay

## 2021-12-06 DIAGNOSIS — Z0000 Encounter for general adult medical examination without abnormal findings: Secondary | ICD-10-CM

## 2021-12-06 DIAGNOSIS — Z0110 Encounter for examination of ears and hearing without abnormal findings: Secondary | ICD-10-CM

## 2021-12-06 LAB — POCT URINALYSIS DIPSTICK
Bilirubin, UA: NEGATIVE
Blood, UA: NEGATIVE
Glucose, UA: NEGATIVE
Ketones, UA: NEGATIVE
Leukocytes, UA: NEGATIVE
Nitrite, UA: NEGATIVE
Protein, UA: NEGATIVE
Spec Grav, UA: <=1.005 — AB (ref 1.010–1.025)
Urobilinogen, UA: 0.2 E.U./dL
pH, UA: 7 (ref 5.0–8.0)

## 2021-12-06 NOTE — Progress Notes (Signed)
Pt presents today for annual physical labs. Pt will return for completion of physical.  ?

## 2021-12-06 NOTE — Progress Notes (Signed)
Presents to [place] [facility] for annual hearing screen.  Works for [employer] is part of [employer]. ? ?AMD ?

## 2021-12-07 LAB — CMP12+LP+TP+TSH+6AC+CBC/D/PLT
ALT: 17 IU/L (ref 0–44)
AST: 24 IU/L (ref 0–40)
Albumin/Globulin Ratio: 3 — ABNORMAL HIGH (ref 1.2–2.2)
Albumin: 5.1 g/dL (ref 4.1–5.2)
Alkaline Phosphatase: 61 IU/L (ref 44–121)
BUN/Creatinine Ratio: 15 (ref 9–20)
BUN: 16 mg/dL (ref 6–20)
Basophils Absolute: 0.1 10*3/uL (ref 0.0–0.2)
Basos: 1 %
Bilirubin Total: 0.9 mg/dL (ref 0.0–1.2)
Calcium: 9.7 mg/dL (ref 8.7–10.2)
Chloride: 103 mmol/L (ref 96–106)
Chol/HDL Ratio: 2.5 ratio (ref 0.0–5.0)
Cholesterol, Total: 135 mg/dL (ref 100–199)
Creatinine, Ser: 1.06 mg/dL (ref 0.76–1.27)
EOS (ABSOLUTE): 0.1 10*3/uL (ref 0.0–0.4)
Eos: 2 %
Estimated CHD Risk: <0.5 times avg. (ref 0.0–1.0)
Free Thyroxine Index: 2.7 (ref 1.2–4.9)
GGT: 14 IU/L (ref 0–65)
Globulin, Total: 1.7 g/dL (ref 1.5–4.5)
Glucose: 86 mg/dL (ref 70–99)
HDL: 54 mg/dL (ref >39)
Hematocrit: 43.6 % (ref 37.5–51.0)
Hemoglobin: 15.8 g/dL (ref 13.0–17.7)
Immature Grans (Abs): 0 10*3/uL (ref 0.0–0.1)
Immature Granulocytes: 0 %
Iron: 182 ug/dL — ABNORMAL HIGH (ref 38–169)
LDH: 210 IU/L (ref 121–224)
LDL Chol Calc (NIH): 67 mg/dL (ref 0–99)
Lymphocytes Absolute: 2.1 10*3/uL (ref 0.7–3.1)
Lymphs: 30 %
MCH: 33.4 pg — ABNORMAL HIGH (ref 26.6–33.0)
MCHC: 36.2 g/dL — ABNORMAL HIGH (ref 31.5–35.7)
MCV: 92 fL (ref 79–97)
Monocytes Absolute: 0.5 10*3/uL (ref 0.1–0.9)
Monocytes: 7 %
Neutrophils Absolute: 4.1 10*3/uL (ref 1.4–7.0)
Neutrophils: 60 %
Phosphorus: 2.9 mg/dL (ref 2.8–4.1)
Platelets: 316 10*3/uL (ref 150–450)
Potassium: 4.2 mmol/L (ref 3.5–5.2)
RBC: 4.73 x10E6/uL (ref 4.14–5.80)
RDW: 10.4 % — ABNORMAL LOW (ref 11.6–15.4)
Sodium: 145 mmol/L — ABNORMAL HIGH (ref 134–144)
T3 Uptake Ratio: 31 % (ref 24–39)
T4, Total: 8.7 ug/dL (ref 4.5–12.0)
TSH: 1.61 u[IU]/mL (ref 0.450–4.500)
Total Protein: 6.8 g/dL (ref 6.0–8.5)
Triglycerides: 68 mg/dL (ref 0–149)
Uric Acid: 5.4 mg/dL (ref 3.8–8.4)
VLDL Cholesterol Cal: 14 mg/dL (ref 5–40)
WBC: 6.9 10*3/uL (ref 3.4–10.8)
eGFR: 97 mL/min/{1.73_m2} (ref >59)

## 2021-12-13 ENCOUNTER — Ambulatory Visit: Payer: Self-pay | Admitting: Physician Assistant

## 2021-12-13 ENCOUNTER — Other Ambulatory Visit: Payer: Self-pay

## 2021-12-13 ENCOUNTER — Encounter: Payer: Self-pay | Admitting: Physician Assistant

## 2021-12-13 VITALS — BP 136/77 | HR 73 | Temp 98.4°F | Resp 14 | Ht 77.0 in | Wt 180.0 lb

## 2021-12-13 DIAGNOSIS — Z0000 Encounter for general adult medical examination without abnormal findings: Secondary | ICD-10-CM

## 2021-12-13 NOTE — Progress Notes (Signed)
Pt denies any issues or concerns at this time/CL,RMA ?

## 2021-12-13 NOTE — Progress Notes (Signed)
? ?[facility] ? ?____________________________________________ ? ? None  ?  (approximate) ? ?I have reviewed the triage vital signs and the nursing notes. ? ? ?HISTORY ? ?Chief Complaint ?Annual Exam ? ?{ ?HPI ?Mark Daniels is a 31 y.o. male patient presents for annual physical exam.  Patient is voiced no concerns or complaints.  Past medical history is remarkable only for migraine headaches.  No current medicine for complaint. ?   ? ?  ? ? ?Past Medical History:  ?Diagnosis Date  ? Ankle pain right  ? Migraine   ? Tonsillitis   ? ? ?Patient Active Problem List  ? Diagnosis Date Noted  ? Ankle pain 05/14/2015  ? Headache, migraine 05/14/2015  ? Infective tonsillitis 05/14/2015  ? ? ?History reviewed. No pertinent surgical history. ? ?Prior to Admission medications   ?Not on File  ? ? ?Allergies ?Patient has no known allergies. ? ?Family History  ?Problem Relation Age of Onset  ? Stroke Maternal Grandmother   ? ? ?Social History ?Social History  ? ?Tobacco Use  ? Smoking status: Never  ? Smokeless tobacco: Former  ?  Types: Chew  ?  Quit date: 02/17/2014  ?Substance Use Topics  ? Alcohol use: No  ?  Alcohol/week: 0.0 standard drinks  ? Drug use: No  ? ? ?Review of Systems ?Constitutional: No fever/chills ?Eyes: No visual changes. ?ENT: No sore throat. ?Cardiovascular: Denies chest pain. ?Respiratory: Denies shortness of breath. ?Gastrointestinal: No abdominal pain.  No nausea, no vomiting.  No diarrhea.  No constipation. ?Genitourinary: Negative for dysuria. ?Musculoskeletal: Negative for back pain. ?Skin: Negative for rash. ?Neurological: Negative for headaches, but denies focal weakness or numbness. ? ?____________________________________________ ? ? ?PHYSICAL EXAM: ? ?VITAL SIGNS: Temperature is 98.4, pulse 73, respiration 14, BP is 136/77, patient 99% O2 sat on room air.  Patient weighs 180 pounds and BMI is 21.36. ?Constitutional: Alert and oriented. Well appearing and in no acute  distress. ?Eyes: Conjunctivae are normal. PERRL. EOMI. ?Head: Atraumatic. ?Nose: No congestion/rhinnorhea. ?Mouth/Throat: Mucous membranes are moist.  Oropharynx non-erythematous. ?Neck: No stridor. No cervical spine tenderness to palpation. ?Hematological/Lymphatic/Immunilogical: No cervical lymphadenopathy. ?Cardiovascular: Normal rate, regular rhythm. Grossly normal heart sounds.  Good peripheral circulation. ?Respiratory: Normal respiratory effort.  No retractions. Lungs CTAB. ?Gastrointestinal: Soft and nontender. No distention. No abdominal bruits. No CVA tenderness. ?Genitourinary:  ?Musculoskeletal: No lower extremity tenderness nor edema.  No joint effusions. ?Neurologic:  Normal speech and language. No gross focal neurologic deficits are appreciated. No gait instability. ?Skin:  Skin is warm, dry and intact. No rash noted. ?Psychiatric: Mood and affect are normal. Speech and behavior are normal. ? ?____________________________________________ ?  ?LABS ? ?____________________________________________ ? ?EKG ?      ?Component Ref Range & Units 7 d ago ?(12/06/21) 1 yr ago ?(09/22/20) 1 yr ago ?(04/30/20)  ?Color, UA  yellow  clear  clear   ?Clarity, UA  clear  clear  clear   ?Glucose, UA Negative Negative  Negative  Negative   ?Bilirubin, UA  negative  negative  neg   ?Ketones, UA  negative  negative  neg   ?Spec Grav, UA 1.010 - 1.025 <=1.005 Abnormal   <=1.005 Abnormal   1.010   ?Blood, UA  negative  negative  neg   ?pH, UA 5.0 - 8.0 7.0  6.0  7.0   ?Protein, UA Negative Negative  Negative  Negative   ?Urobilinogen, UA 0.2 or 1.0 E.U./dL 0.2  0.2  0.2   ?Nitrite, UA  negative  negative  neg   ?Leukocytes, UA Negative Negative  Negative  Negative   ?Appearance   clear  clear   ?Odor        ?  ? ?  ?  ?Specimen Collected: 12/06/21 09:44 Last Resulted: 12/06/21 09:44  ?  ?  Lab Flowsheet   ? Order Details   ? View Encounter   ? Lab and Collection Details   ? Routing   ? Result History    ?View Encounter  Conversation    ?  ?  ?Result Care Coordination ? ? ?Patient Communication ? ? Add Comments   Add Notifications  Back to Top  ?  ?  ? ?Other Results from 12/06/2021 ? ? Contains abnormal data CMP12+LP+TP+TSH+6AC+CBC/D/Plt ?Order: [id number] ?Status: Final result    ?Visible to patient: Yes (not seen)    ?Next appt: None    ?Dx: Routine adult health maintenance    ?0 Result Notes ?       ?Component Ref Range & Units 7 d ago ?(12/06/21) 1 yr ago ?(09/22/20) 1 yr ago ?(04/23/20) 7 yr ago ?(05/18/14)  ?Glucose 70 - 99 mg/dL 86  78 R  92 R    ?Uric Acid 3.8 - 8.4 mg/dL 5.4  5.3 CM  6.7 CM    ?Comment:            Therapeutic target for gout patients: <6.0  ?BUN 6 - 20 mg/dL _0 ?Creatinine, Ser 0.76 - 1.27 mg/dL 1.06  1.00  0.98    ?eGFR >59 mL/min/1.73 97      ?BUN/Creatinine Ratio 9 - _1 ?Sodium 134 - 144 mmol/L 145 High   140  139    ?Potassium 3.5 - 5.2 mmol/L 4.2  3.7  3.6    ?Chloride 96 - 106 mmol/L 103  101  102    ?Calcium 8.7 - 10.2 mg/dL 9.7  9.4  9.2    ?Phosphorus 2.8 - 4.1 mg/dL 2.9  3.0  2.8    ?Total Protein 6.0 - 8.5 g/dL 6.8  7.2  6.6    ?Albumin 4.1 - 5.2 g/dL 5.1  5.0  4.7    ?Globulin, Total 1.5 - 4.5 g/dL 1.7  2.2  1.9    ?Albumin/Globulin Ratio 1.2 - 2.2 3.0 High   2.3 High   2.5 High     ?Bilirubin Total 0.0 - 1.2 mg/dL 0.9  0.4  1.0    ?Alkaline Phosphatase 44 - 121 IU/L 61  54 CM  49 R    ?LDH 121 - 224 IU/L 210  180  170    ?AST 0 - 40 IU/L _2 ?ALT 0 - 44 IU/L _3 ?GGT 0 - 65 IU/L _4 ?Iron 38 - 169 ug/dL 182 High   66  94    ?Cholesterol, Total 100 - 199 mg/dL 135  139  120    ?Triglycerides 0 - 149 mg/dL 68  66  40    ?HDL >39 mg/dL 54  48  49    ?VLDL Cholesterol Cal 5 - 40 mg/dL _5 ?LDL Chol Calc (NIH) 0 - 99 mg/dL 67  78  61    ?Chol/HDL Ratio 0.0 - 5.0 ratio 2.5  2.9 CM  2.4 CM    ?  Comment:                                   T. Chol/HDL Ratio  ?                                            Men  Women  ?                              1/2  Avg.Risk  3.4    3.3  ?                                  Avg.Risk  5.0    4.4  ?                               2X Avg.Risk  9.6    7.1  ?                               3X Avg.Risk 23.4   11.0   ?Estimated CHD Risk 0.0 - 1.0 times avg.  < 0.5   < 0.5 CM   < 0.5 CM    ?Comment: The CHD Risk is based on the T. Chol/HDL ratio. Other  ?factors affect CHD Risk such as hypertension, smoking,  ?diabetes, severe obesity, and family history of  ?premature CHD.   ?TSH 0.450 - 4.500 uIU/mL 1.610  1.630  0.981    ?T4, Total 4.5 - 12.0 ug/dL 8.7  7.4  7.6    ?T3 Uptake Ratio 24 - 39 % 31  29  33    ?Free Thyroxine Index 1.2 - 4.9 2.7  2.1  2.5    ?WBC 3.4 - 10.8 x10E3/uL 6.9  6.2  4.6  8.4 R   ?RBC 4.14 - 5.80 x10E6/uL 4.73  4.92  4.60  4.49 R   ?Hemoglobin 13.0 - 17.7 g/dL 15.8  16.2  14.9    ?Hematocrit 37.5 - 51.0 % 43.6  46.3  42.8    ?MCV 79 - 97 fL 92  94  93  96 R   ?MCH 26.6 - 33.0 pg 33.4 High   32.9  32.4  32.3 R   ?MCHC 31.5 - 35.7 g/dL 36.2 High   35.0  34.8  33.6 R   ?RDW 11.6 - 15.4 % 10.4 Low   11.0 Low   12.0  11.6 R   ?Platelets 150 - 450 x10E3/uL 316  303  287  222 R   ?Neutrophils Not Estab. % 60  46  51  71.2 R   ?Lymphs Not Estab. % 30  42  36    ?Monocytes Not Estab. % _0 ?Eos Not Estab. % _1 ?Basos Not Estab. % _2 ?Neutrophils Absolute 1.4 - 7.0 x10E3/uL 4.1  2.9  2.4  6.0 R   ?Lymphocytes Absolute 0.7 - 3.1 x10E3/uL 2.1  2.6  1.7  1.5 R   ?Monocytes Absolute 0.1 - 0.9  x10E3/uL 0.5  0.4  0.4    ?EOS (ABSOLUTE) 0.0 - 0.4 x10E3/uL 0.1  0.2  0.1    ?Basophils Absolute 0.0 - 0.2 x10E3/uL 0.1  0.0  0.0  0.0 R   ?Immature Granulocytes Not Estab. % 0  1  1    ?Immature Grans        ?  ? ? ? ?____________________________________________ ? ? ?____________________________________________ ? ? ?INITIAL IMPRESSION / ASSESSMENT AND PLAN  ?As part of my medical decision making, I reviewed the following data within the [facility]  ? ?   ?Discussed lab results with patient with no  acute findings. ?  ? ? ? ? ? ?____________________________________________ ? ? ?FINAL CLINICAL IMPRESSION(S) / ED DIAGNOSES ? ?Well exam ? ? ?ED Discharge Orders   ? ? None  ? ?  ? ? ? ?Note:  This document was prepared usi

## 2021-12-28 ENCOUNTER — Other Ambulatory Visit: Payer: Self-pay

## 2021-12-28 DIAGNOSIS — Z0283 Encounter for blood-alcohol and blood-drug test: Secondary | ICD-10-CM

## 2021-12-28 NOTE — Progress Notes (Signed)
Presents to [facility] for Random DOT Drug screen. ? ?[facility] Acct #:  [id number] ?[facility] Specimen #:  [id number] ? ?AMD ?

## 2022-01-21 DIAGNOSIS — L739 Follicular disorder, unspecified: Secondary | ICD-10-CM | POA: Diagnosis not present

## 2022-07-28 ENCOUNTER — Ambulatory Visit: Payer: Self-pay | Admitting: Physician Assistant

## 2022-07-28 ENCOUNTER — Encounter: Payer: Self-pay | Admitting: Physician Assistant

## 2022-07-28 DIAGNOSIS — J020 Streptococcal pharyngitis: Secondary | ICD-10-CM

## 2022-07-28 MED ORDER — LIDOCAINE VISCOUS HCL 2 % MT SOLN: 5.0000 mL | Freq: Four times a day (QID) | OROMUCOSAL | 0 refills | Status: DC | PRN

## 2022-07-28 MED ORDER — IBUPROFEN 800 MG PO TABS: 800.0000 mg | ORAL_TABLET | Freq: Three times a day (TID) | ORAL | 0 refills | Status: DC | PRN

## 2022-07-28 MED ORDER — PSEUDOEPH-BROMPHEN-DM 30-2-10 MG/5ML PO SYRP: 5.0000 mL | ORAL_SOLUTION | Freq: Four times a day (QID) | ORAL | 0 refills | Status: DC | PRN

## 2022-07-28 MED ORDER — AMOXICILLIN 875 MG PO TABS: 875.0000 mg | ORAL_TABLET | Freq: Two times a day (BID) | ORAL | 0 refills | Status: DC

## 2022-07-28 NOTE — Progress Notes (Signed)
   Subjective: Sore throat    Patient ID: Mark Daniels, male    DOB: April 08, 1991, 31 y.o.   MRN: [id number]  HPI Patient presents with a.m. awakening with sore throat.  Patient states family member was diagnosed with strep pharyngitis yesterday.  Patient states mild fever and cough.  Patient able to tolerate food and fluids.   Review of Systems Negative except for chief complaint    Objective:   Physical Exam  Deferred due to this being a telephonic encounter.      Assessment & Plan: Pharyngitis   Patient given a prescription for amoxicillin, ibuprofen, Bromfed-DM, and viscous lidocaine.  Take medication as directed and follow-up if condition worsens.

## 2022-07-28 NOTE — Progress Notes (Signed)
Son tested positive yesterday for strep throat, pt woke up with slight fever and sore scratchy throat.

## 2022-08-03 ENCOUNTER — Encounter: Payer: Self-pay | Admitting: Physician Assistant

## 2022-08-03 ENCOUNTER — Ambulatory Visit: Payer: 59 | Admitting: Physician Assistant

## 2022-08-03 VITALS — BP 137/79 | HR 83 | Temp 98.4°F | Resp 14 | Ht 77.0 in | Wt 185.0 lb

## 2022-08-03 DIAGNOSIS — G44201 Tension-type headache, unspecified, intractable: Secondary | ICD-10-CM

## 2022-08-03 DIAGNOSIS — G43001 Migraine without aura, not intractable, with status migrainosus: Secondary | ICD-10-CM

## 2022-08-03 MED ORDER — ONDANSETRON 8 MG PO TBDP: 8.0000 mg | ORAL_TABLET | Freq: Three times a day (TID) | ORAL | 0 refills | Status: AC | PRN

## 2022-08-03 NOTE — Progress Notes (Signed)
Pt presents today stating he's had issues with migraines since 6th grade. Usually he has a migraine about 1 every three months as of now the last 30 days he has had 3 migraines. nausea and the head ache pain is the worst but does have slight vision and hearing sensitivity.

## 2022-08-03 NOTE — Progress Notes (Signed)
   Subjective: Migraine headache    Patient ID: Mark Daniels, male    DOB: 03/13/1991, 31 y.o.   MRN: [id number]  HPI Patient presents with nausea associated with headache.  Patient states has a history of migraine headaches but they are coming more frequent in the past couple months.  Patient stated normally 1 headache a month.  In the past 2 months 2-3 headaches all associated with nausea.  Patient also states that his decreased visual acuity lasting 20 to 30 minutes with each episode.  Denies facial or body weakness.  Patient states headache started as a teenager and had 1 neurological evaluation with no acute findings.  Patient was just given medication for nausea.  Last headache was yesterday.   Review of Systems Negative except for chief complaint    Objective:   Physical Exam BP is 137/79, pulse 83, respiration 14, temperature 98.4, patient 90% O2 sat on room air.  Patient was 185 pounds and BMI 21.94. HEENT was unremarkable.  Neck was supple for lymphadenopathy or bruits.  Lungs are clear to auscultation.  Heart regular rate and rhythm.      Assessment & Plan: Migraine headache   Advised patient further evaluation for headache is warranted.  Patient given a prescription for Zofran to take for nausea.  Patient will be scheduled for CT scan of the head and possible consult to neurology.

## 2022-08-10 ENCOUNTER — Telehealth: Payer: Self-pay

## 2022-08-10 NOTE — Telephone Encounter (Signed)
I left a voice mail Monday (08/08/2022) for you to call the clinic at [phone]. I haven't heard from you. You were seen in the clinic by Nona Dell, PA-C on 08/03/2022 for migraine headaches. He ordered a Head CT. The procedure doesn't require prior authorization. The case number is [id number]. You know you're schedule and availability, so it will be easier for you to call [facility] Scheduling to schedule the procedure. The phone number to call is [phone].  If you have any questions, please let me know.   Charm Barges, RN [facility] [address] [place], [place]  [address] P:  [phone] F:  [phone] [email or web address]

## 2022-11-23 ENCOUNTER — Ambulatory Visit: Payer: Self-pay

## 2022-11-23 DIAGNOSIS — Z0000 Encounter for general adult medical examination without abnormal findings: Secondary | ICD-10-CM

## 2022-11-23 LAB — POCT URINALYSIS DIPSTICK
Bilirubin, UA: NEGATIVE
Blood, UA: POSITIVE
Glucose, UA: NEGATIVE
Ketones, UA: NEGATIVE
Leukocytes, UA: NEGATIVE
Nitrite, UA: NEGATIVE
Protein, UA: NEGATIVE
Spec Grav, UA: 1.02 (ref 1.010–1.025)
Urobilinogen, UA: 0.2 E.U./dL
pH, UA: 6 (ref 5.0–8.0)

## 2022-11-23 NOTE — Progress Notes (Signed)
PT presents today to complete lab portion of physical. Mark Daniels

## 2022-11-24 LAB — CMP12+LP+TP+TSH+6AC+CBC/D/PLT
ALT: 20 IU/L (ref 0–44)
AST: 24 IU/L (ref 0–40)
Albumin/Globulin Ratio: 2.4 — ABNORMAL HIGH (ref 1.2–2.2)
Albumin: 4.6 g/dL (ref 4.3–5.2)
Alkaline Phosphatase: 59 IU/L (ref 44–121)
BUN/Creatinine Ratio: 20 (ref 9–20)
BUN: 21 mg/dL — ABNORMAL HIGH (ref 6–20)
Basophils Absolute: 0 10*3/uL (ref 0.0–0.2)
Basos: 1 %
Bilirubin Total: 0.4 mg/dL (ref 0.0–1.2)
Calcium: 9.3 mg/dL (ref 8.7–10.2)
Chloride: 103 mmol/L (ref 96–106)
Chol/HDL Ratio: 3 ratio (ref 0.0–5.0)
Cholesterol, Total: 134 mg/dL (ref 100–199)
Creatinine, Ser: 1.05 mg/dL (ref 0.76–1.27)
EOS (ABSOLUTE): 0.2 10*3/uL (ref 0.0–0.4)
Eos: 3 %
Estimated CHD Risk: <0.5 times avg. (ref 0.0–1.0)
Free Thyroxine Index: 2.2 (ref 1.2–4.9)
GGT: 12 IU/L (ref 0–65)
Globulin, Total: 1.9 g/dL (ref 1.5–4.5)
Glucose: 46 mg/dL — ABNORMAL LOW (ref 70–99)
HDL: 44 mg/dL (ref >39)
Hematocrit: 47.3 % (ref 37.5–51.0)
Hemoglobin: 16.1 g/dL (ref 13.0–17.7)
Immature Grans (Abs): 0 10*3/uL (ref 0.0–0.1)
Immature Granulocytes: 0 %
Iron: 87 ug/dL (ref 38–169)
LDH: 195 IU/L (ref 121–224)
LDL Chol Calc (NIH): 78 mg/dL (ref 0–99)
Lymphocytes Absolute: 1.7 10*3/uL (ref 0.7–3.1)
Lymphs: 30 %
MCH: 32.9 pg (ref 26.6–33.0)
MCHC: 34 g/dL (ref 31.5–35.7)
MCV: 97 fL (ref 79–97)
Monocytes Absolute: 0.4 10*3/uL (ref 0.1–0.9)
Monocytes: 6 %
Neutrophils Absolute: 3.5 10*3/uL (ref 1.4–7.0)
Neutrophils: 60 %
Phosphorus: 2.8 mg/dL (ref 2.8–4.1)
Platelets: 300 10*3/uL (ref 150–450)
Potassium: 4.7 mmol/L (ref 3.5–5.2)
RBC: 4.89 x10E6/uL (ref 4.14–5.80)
RDW: 11.6 % (ref 11.6–15.4)
Sodium: 142 mmol/L (ref 134–144)
T3 Uptake Ratio: 34 % (ref 24–39)
T4, Total: 6.6 ug/dL (ref 4.5–12.0)
TSH: 1.06 u[IU]/mL (ref 0.450–4.500)
Total Protein: 6.5 g/dL (ref 6.0–8.5)
Triglycerides: 55 mg/dL (ref 0–149)
Uric Acid: 5.8 mg/dL (ref 3.8–8.4)
VLDL Cholesterol Cal: 12 mg/dL (ref 5–40)
WBC: 5.8 10*3/uL (ref 3.4–10.8)
eGFR: 98 mL/min/{1.73_m2} (ref >59)

## 2022-11-30 ENCOUNTER — Encounter: Payer: Self-pay | Admitting: Physician Assistant

## 2022-11-30 ENCOUNTER — Ambulatory Visit: Payer: Self-pay | Admitting: Physician Assistant

## 2022-11-30 VITALS — BP 126/69 | HR 90 | Temp 97.5°F | Resp 14 | Wt 191.8 lb

## 2022-11-30 DIAGNOSIS — Z0000 Encounter for general adult medical examination without abnormal findings: Secondary | ICD-10-CM

## 2022-11-30 NOTE — Progress Notes (Signed)
[facility] } ____________________________________________   None    (approximate)  I have reviewed the triage vital signs and the nursing notes.   HISTORY  Chief Complaint No chief complaint on file.   HPI Mark Daniels is a 32 y.o. male patient presents for annual physical exam.  Patient voices no concerns or complaints.         Past Medical History:  Diagnosis Date   Ankle pain right   Migraine    Tonsillitis     Patient Active Problem List   Diagnosis Date Noted   Ankle pain 05/14/2015   Headache, migraine 05/14/2015   Infective tonsillitis 05/14/2015    No past surgical history on file.  Prior to Admission medications   Medication Sig Start Date End Date Taking? Authorizing Provider  ondansetron (ZOFRAN-ODT) 8 MG disintegrating tablet Take 1 tablet (8 mg total) by mouth every 8 (eight) hours as needed for nausea or vomiting. Patient not taking: Reported on 11/30/2022 08/03/22   Sable Feil, PA-C    Allergies Patient has no known allergies.  Family History  Problem Relation Age of Onset   Stroke Maternal Grandmother     Social History Social History   Tobacco Use   Smoking status: Never   Smokeless tobacco: Former    Types: Chew    Quit date: 02/17/2014  Substance Use Topics   Alcohol use: No    Alcohol/week: 0.0 standard drinks of alcohol   Drug use: No    Review of Systems Constitutional: No fever/chills Eyes: No visual changes. ENT: No sore throat. Cardiovascular: Denies chest pain. Respiratory: Denies shortness of breath. Gastrointestinal: No abdominal pain.  No nausea, no vomiting.  No diarrhea.  No constipation. Genitourinary: Negative for dysuria. Musculoskeletal: Negative for back pain. Skin: Negative for rash. Neurological: Negative for headaches, focal weakness or numbness. ____________________________________________   PHYSICAL EXAM:  VITAL SIGNS: BP is 126/69, pulse 90,  respiration 14, temperature 97.5, patient 1% O2 sat on room air.  Patient weighs 191 pounds and BMI is 22.74. Constitutional: Alert and oriented. Well appearing and in no acute distress. Eyes: Conjunctivae are normal. PERRL. EOMI. Head: Atraumatic. Nose: No congestion/rhinnorhea. Mouth/Throat: Mucous membranes are moist.  Oropharynx non-erythematous. Neck: No stridor.  No cervical spine tenderness to palpation. Hematological/Lymphatic/Immunilogical: No cervical lymphadenopathy. Cardiovascular: Normal rate, regular rhythm. Grossly normal heart sounds.  Good peripheral circulation. Respiratory: Normal respiratory effort.  No retractions. Lungs CTAB. Gastrointestinal: Soft and nontender. No distention. No abdominal bruits. No CVA tenderness. Genitourinary: Deferred Musculoskeletal: No lower extremity tenderness nor edema.  No joint effusions. Neurologic:  Normal speech and language. No gross focal neurologic deficits are appreciated. No gait instability. Skin:  Skin is warm, dry and intact. No rash noted. Psychiatric: Mood and affect are normal. Speech and behavior are normal.  ____________________________________________   LABS _        Component Ref Range & Units 7 d ago (11/23/22) 11 mo ago (12/06/21) 2 yr ago (09/22/20) 2 yr ago (04/30/20)  Color, UA  Yellow yellow clear clear  Clarity, UA  Clear clear clear clear  Glucose, UA Negative Negative Negative Negative Negative  Bilirubin, UA  Negative negative negative neg  Ketones, UA  Negative negative negative neg  Spec Grav, UA 1.010 - 1.025 1.020 <=1.005 Abnormal  <=1.005 Abnormal  1.010  Blood, UA  Positive negative negative neg  Comment: 1+  pH, UA 5.0 - 8.0 6.0 7.0 6.0 7.0  Protein, UA Negative Negative Negative  Negative Negative  Urobilinogen, UA 0.2 or 1.0 E.U./dL 0.2 0.2 0.2 0.2  Nitrite, UA  Negative negative negative neg  Leukocytes, UA Negative Negative Negative Negative Negative  Appearance    clear clear  Odor               Specimen Collected: 11/23/22 09:17 Last Resulted: 11/23/22 09:17      Lab Flowsheet      Order Details      View Encounter      Lab and Collection Details      Routing      Result History    View All Conversations on this Encounter        Result Care Coordination   Patient Communication   Add Comments   Add Notifications  Back to Top      Other Results from 11/23/2022   Contains abnormal data CMP12+LP+TP+TSH+6AC+CBC/D/Plt Order: [id number] Status: Final result      Visible to patient: Yes (not seen)      Next appt: None      Dx: Routine adult health maintenance    0 Result Notes            Component Ref Range & Units 7 d ago (11/23/22) 11 mo ago (12/06/21) 2 yr ago (09/22/20) 2 yr ago (04/23/20) 8 yr ago (05/18/14)  Glucose 70 - 99 mg/dL 46 Low  86 78 R 92 R   Uric Acid 3.8 - 8.4 mg/dL 5.8 5.4 CM 5.3 CM 6.7 CM   Comment:            Therapeutic target for gout patients: <6.0  BUN 6 - 20 mg/dL 21 High  '16 15 13   '$ Creatinine, Ser 0.76 - 1.27 mg/dL 1.05 1.06 1.00 0.98   eGFR >59 mL/min/1.73 98 97     BUN/Creatinine Ratio 9 - '20 20 15 15 13   '$ Sodium 134 - 144 mmol/L 142 145 High  140 139   Potassium 3.5 - 5.2 mmol/L 4.7 4.2 3.7 3.6   Chloride 96 - 106 mmol/L 103 103 101 102   Calcium 8.7 - 10.2 mg/dL 9.3 9.7 9.4 9.2   Phosphorus 2.8 - 4.1 mg/dL 2.8 2.9 3.0 2.8   Total Protein 6.0 - 8.5 g/dL 6.5 6.8 7.2 6.6   Albumin 4.3 - 5.2 g/dL 4.6 5.1 R 5.0 R 4.7 R   Globulin, Total 1.5 - 4.5 g/dL 1.9 1.7 2.2 1.9   Albumin/Globulin Ratio 1.2 - 2.2 2.4 High  3.0 High  2.3 High  2.5 High    Bilirubin Total 0.0 - 1.2 mg/dL 0.4 0.9 0.4 1.0   Alkaline Phosphatase 44 - 121 IU/L 59 61 54 CM 49 R   LDH 121 - 224 IU/L 195 210 180 170   AST 0 - 40 IU/L '24 24 14 20   '$ ALT 0 - 44 IU/L '20 17 10 19   '$ GGT 0 - 65 IU/L '12 14 12 14   '$ Iron 38 - 169 ug/dL 87 182 High  66 94   Cholesterol, Total 100 - 199 mg/dL 134 135 139 120   Triglycerides 0 - 149 mg/dL 55 68 66 40   HDL >39  mg/dL 44 54 48 49   VLDL Cholesterol Cal 5 - 40 mg/dL '12 14 13 10   '$ LDL Chol Calc (NIH) 0 - 99 mg/dL 78 67 78 61   Chol/HDL Ratio 0.0 - 5.0 ratio 3.0 2.5 CM 2.9 CM 2.4 CM   Comment:  T. Chol/HDL Ratio                                             Men  Women                               1/2 Avg.Risk  3.4    3.3                                   Avg.Risk  5.0    4.4                                2X Avg.Risk  9.6    7.1                                3X Avg.Risk 23.4   11.0  Estimated CHD Risk 0.0 - 1.0 times avg.  < 0.5  < 0.5 CM  < 0.5 CM  < 0.5 CM   Comment: The CHD Risk is based on the T. Chol/HDL ratio. Other factors affect CHD Risk such as hypertension, smoking, diabetes, severe obesity, and family history of premature CHD.  TSH 0.450 - 4.500 uIU/mL 1.060 1.610 1.630 0.981   T4, Total 4.5 - 12.0 ug/dL 6.6 8.7 7.4 7.6   T3 Uptake Ratio 24 - 39 % 34 31 29 33   Free Thyroxine Index 1.2 - 4.9 2.2 2.7 2.1 2.5   WBC 3.4 - 10.8 x10E3/uL 5.8 6.9 6.2 4.6 8.4 R  RBC 4.14 - 5.80 x10E6/uL 4.89 4.73 4.92 4.60 4.49 R  Hemoglobin 13.0 - 17.7 g/dL 16.1 15.8 16.2 14.9   Hematocrit 37.5 - 51.0 % 47.3 43.6 46.3 42.8   MCV 79 - 97 fL 97 92 94 93 96 R  MCH 26.6 - 33.0 pg 32.9 33.4 High  32.9 32.4 32.3 R  MCHC 31.5 - 35.7 g/dL 34.0 36.2 High  35.0 34.8 33.6 R  RDW 11.6 - 15.4 % 11.6 10.4 Low  11.0 Low  12.0 11.6 R  Platelets 150 - 450 x10E3/uL 300 316 303 287 222 R  Neutrophils Not Estab. % 60 60 46 51 71.2 R  Lymphs Not Estab. % 30 30 42 36   Monocytes Not Estab. % '6 7 7 8   '$ Eos Not Estab. % '3 2 3 3   '$ Basos Not Estab. % '1 1 1 1   '$ Neutrophils Absolute 1.4 - 7.0 x10E3/uL 3.5 4.1 2.9 2.4 6.0 R  Lymphocytes Absolute 0.7 - 3.1 x10E3/uL 1.7 2.1 2.6 1.7 1.5 R  Monocytes Absolute 0.1 - 0.9 x10E3/uL 0.4 0.5 0.4 0.4   EOS (ABSOLUTE) 0.0 - 0.4 x10E3/uL 0.2 0.1 0.2 0.1   Basophils Absolute 0.0 - 0.2 x10E3/uL 0.0 0.1 0.0 0.0 0.0 R  Immature Granulocytes Not Estab. % 0 0 1 1                ___________________________________________     ____________________________________________   INITIAL IMPRESSION / ASSESSMENT AND PLAN  As part of my medical decision making, I reviewed the following data within the electronic MEDICAL RECORD NUMBER Notes from prior ED visits and  [place] Controlled Substance Database             ____________________________________________   FINAL CLINICAL IMPRESSION  Well exam   ED Discharge Orders     None        Note:  This document was prepared using Dragon voice recognition software and may include unintentional dictation errors.

## 2022-11-30 NOTE — Progress Notes (Signed)
Here for yearly physical with provider for [facility].  No c/o voiced.

## 2022-12-30 ENCOUNTER — Other Ambulatory Visit: Payer: Self-pay

## 2022-12-30 NOTE — Progress Notes (Signed)
Pt completed random DOT UDS. Results pending with [facility].

## 2023-10-19 ENCOUNTER — Other Ambulatory Visit: Payer: Self-pay

## 2023-10-19 DIAGNOSIS — Z0283 Encounter for blood-alcohol and blood-drug test: Secondary | ICD-10-CM

## 2023-10-19 NOTE — Progress Notes (Signed)
Pt completed Random UDS, Results pending with [facility].  ETOH CLEARED-NEGATIVE

## 2023-11-21 ENCOUNTER — Ambulatory Visit: Payer: Self-pay

## 2023-11-21 DIAGNOSIS — Z0000 Encounter for general adult medical examination without abnormal findings: Secondary | ICD-10-CM

## 2023-11-21 LAB — POCT URINALYSIS DIPSTICK
Bilirubin, UA: NEGATIVE
Glucose, UA: NEGATIVE
Ketones, UA: NEGATIVE
Leukocytes, UA: NEGATIVE
Nitrite, UA: NEGATIVE
Protein, UA: NEGATIVE
Spec Grav, UA: 1.025 (ref 1.010–1.025)
Urobilinogen, UA: 0.2 U/dL
pH, UA: 6 (ref 5.0–8.0)

## 2023-11-21 NOTE — Progress Notes (Signed)
Pt presents today to complete physical labs. 

## 2023-11-22 LAB — CMP12+LP+TP+TSH+6AC+CBC/D/PLT
ALT: 22 IU/L (ref 0–44)
AST: 22 IU/L (ref 0–40)
Albumin: 4.7 g/dL (ref 4.1–5.1)
Alkaline Phosphatase: 60 IU/L (ref 44–121)
BUN/Creatinine Ratio: 19 (ref 9–20)
BUN: 19 mg/dL (ref 6–20)
Basophils Absolute: 0.1 10*3/uL (ref 0.0–0.2)
Basos: 1 %
Bilirubin Total: 0.5 mg/dL (ref 0.0–1.2)
Calcium: 9.2 mg/dL (ref 8.7–10.2)
Chloride: 103 mmol/L (ref 96–106)
Chol/HDL Ratio: 2.8 ratio (ref 0.0–5.0)
Cholesterol, Total: 143 mg/dL (ref 100–199)
Creatinine, Ser: 0.98 mg/dL (ref 0.76–1.27)
EOS (ABSOLUTE): 0.2 10*3/uL (ref 0.0–0.4)
Eos: 3 %
Estimated CHD Risk: <0.5 times avg. (ref 0.0–1.0)
Free Thyroxine Index: 3 (ref 1.2–4.9)
GGT: 17 IU/L (ref 0–65)
Globulin, Total: 2.2 g/dL (ref 1.5–4.5)
Glucose: 100 mg/dL — ABNORMAL HIGH (ref 70–99)
HDL: 52 mg/dL (ref >39)
Hematocrit: 45.1 % (ref 37.5–51.0)
Hemoglobin: 15.4 g/dL (ref 13.0–17.7)
Immature Grans (Abs): 0 10*3/uL (ref 0.0–0.1)
Immature Granulocytes: 1 %
Iron: 82 ug/dL (ref 38–169)
LDH: 180 IU/L (ref 121–224)
LDL Chol Calc (NIH): 80 mg/dL (ref 0–99)
Lymphocytes Absolute: 1.9 10*3/uL (ref 0.7–3.1)
Lymphs: 35 %
MCH: 33.3 pg — ABNORMAL HIGH (ref 26.6–33.0)
MCHC: 34.1 g/dL (ref 31.5–35.7)
MCV: 97 fL (ref 79–97)
Monocytes Absolute: 0.4 10*3/uL (ref 0.1–0.9)
Monocytes: 7 %
Neutrophils Absolute: 2.9 10*3/uL (ref 1.4–7.0)
Neutrophils: 53 %
Phosphorus: 3.2 mg/dL (ref 2.8–4.1)
Platelets: 269 10*3/uL (ref 150–450)
Potassium: 3.9 mmol/L (ref 3.5–5.2)
RBC: 4.63 x10E6/uL (ref 4.14–5.80)
RDW: 11.5 % — ABNORMAL LOW (ref 11.6–15.4)
Sodium: 144 mmol/L (ref 134–144)
T3 Uptake Ratio: 34 % (ref 24–39)
T4, Total: 8.9 ug/dL (ref 4.5–12.0)
TSH: 1.27 u[IU]/mL (ref 0.450–4.500)
Total Protein: 6.9 g/dL (ref 6.0–8.5)
Triglycerides: 47 mg/dL (ref 0–149)
Uric Acid: 5.5 mg/dL (ref 3.8–8.4)
VLDL Cholesterol Cal: 11 mg/dL (ref 5–40)
WBC: 5.4 10*3/uL (ref 3.4–10.8)
eGFR: 106 mL/min/{1.73_m2} (ref >59)

## 2023-11-28 ENCOUNTER — Encounter: Payer: Self-pay | Admitting: Physician Assistant

## 2023-11-28 ENCOUNTER — Ambulatory Visit: Payer: Self-pay | Admitting: Physician Assistant

## 2023-11-28 VITALS — BP 120/64 | HR 69 | Resp 14 | Ht 77.0 in | Wt 180.0 lb

## 2023-11-28 DIAGNOSIS — Z0000 Encounter for general adult medical examination without abnormal findings: Secondary | ICD-10-CM

## 2023-11-28 NOTE — Progress Notes (Signed)
 [facility]  ____________________________________________   None    (approximate)  I have reviewed the triage vital signs and the nursing notes.   HISTORY  Chief Complaint Annual Exam   HPI Mark Daniels is a 33 y.o. male patient presents for annual physical exam.  Patient voices no concerns or complaints.         Past Medical History:  Diagnosis Date   Ankle pain right   Migraine    Tonsillitis     Patient Active Problem List   Diagnosis Date Noted   Ankle pain 05/14/2015   Headache, migraine 05/14/2015   Infective tonsillitis 05/14/2015    No past surgical history on file.  Prior to Admission medications   Medication Sig Start Date End Date Taking? Authorizing Provider  ondansetron (ZOFRAN-ODT) 8 MG disintegrating tablet Take 1 tablet (8 mg total) by mouth every 8 (eight) hours as needed for nausea or vomiting. Patient not taking: Reported on 11/30/2022 08/03/22   Joni Reining, PA-C    Allergies Patient has no known allergies.  Family History  Problem Relation Age of Onset   Stroke Maternal Grandmother     Social History Social History   Tobacco Use   Smoking status: Never   Smokeless tobacco: Former    Types: Chew    Quit date: 02/17/2014  Substance Use Topics   Alcohol use: No    Alcohol/week: 0.0 standard drinks of alcohol   Drug use: No    Review of Systems Constitutional: No fever/chills Eyes: No visual changes. ENT: No sore throat. Cardiovascular: Denies chest pain. Respiratory: Denies shortness of breath. Gastrointestinal: No abdominal pain.  No nausea, no vomiting.  No diarrhea.  No constipation. Genitourinary: Negative for dysuria. Musculoskeletal: Negative for back pain. Skin: Negative for rash. Neurological: Negative for headaches, focal weakness or numbness. ____________________________________________   PHYSICAL EXAM:  VITAL SIGNS: BP 120/64  Cuff Size Normal  Pulse Rate 69   Weight 180 lb (81.6 kg)  Height 6\' 5"  (1.956 m)  Resp 14  SpO2 100 %   BMI: 21.34 kg/m2  BSA: 2.11 m2   Constitutional: Alert and oriented. Well appearing and in no acute distress. Eyes: Conjunctivae are normal. PERRL. EOMI. Head: Atraumatic. Nose: No congestion/rhinnorhea. Mouth/Throat: Mucous membranes are moist.  Oropharynx non-erythematous. Neck: No stridor.  No cervical spine tenderness to palpation. Hematological/Lymphatic/Immunilogical: No cervical lymphadenopathy. Cardiovascular: Normal rate, regular rhythm. Grossly normal heart sounds.  Good peripheral circulation. Respiratory: Normal respiratory effort.  No retractions. Lungs CTAB. Gastrointestinal: Soft and nontender. No distention. No abdominal bruits. No CVA tenderness. Genitourinary: Deferred Musculoskeletal: No lower extremity tenderness nor edema.  No joint effusions. Neurologic:  Normal speech and language. No gross focal neurologic deficits are appreciated. No gait instability. Skin:  Skin is warm, dry and intact. No rash noted. Psychiatric: Mood and affect are normal. Speech and behavior are normal.  ____________________________________________   LABS         Component Ref Range & Units (hover) 7 d ago (11/21/23) 1 yr ago (11/23/22) 1 yr ago (12/06/21) 3 yr ago (09/22/20) 3 yr ago (04/30/20)  Color, UA yellow Yellow yellow clear clear  Clarity, UA clear Clear clear clear clear  Glucose, UA Negative Negative Negative Negative Negative  Bilirubin, UA neg Negative negative negative neg  Ketones, UA neg Negative negative negative neg  Spec Grav, UA 1.025 1.020 <=1.005 Abnormal  <=1.005 Abnormal  1.010  Blood, UA 1+ Abnormal  Positive CM negative negative neg  pH, UA 6.0 6.0 7.0 6.0 7.0  Protein, UA Negative Negative Negative Negative Negative  Urobilinogen, UA 0.2 0.2 0.2 0.2 0.2  Nitrite, UA neg Negative negative negative neg  Leukocytes, UA Negative Negative Negative Negative Negative  Appearance    clear  clear  Odor                  View All Conversations on this Encounter              Component Ref Range & Units (hover) 7 d ago (11/21/23) 1 yr ago (11/23/22) 1 yr ago (12/06/21) 3 yr ago (09/22/20) 3 yr ago (04/23/20) 9 yr ago (05/18/14)  Glucose 100 High  46 Low  86 78 R 92 R   Uric Acid 5.5 5.8 CM 5.4 CM 5.3 CM 6.7 CM   Comment:            Therapeutic target for gout patients: <6.0  BUN 19 21 High  16 15 13    Creatinine, Ser 0.98 1.05 1.06 1.00 0.98   eGFR 106 98 97     BUN/Creatinine Ratio 19 20 15 15 13    Sodium 144 142 145 High  140 139   Potassium 3.9 4.7 4.2 3.7 3.6   Chloride 103 103 103 101 102   Calcium 9.2 9.3 9.7 9.4 9.2   Phosphorus 3.2 2.8 2.9 3.0 2.8   Total Protein 6.9 6.5 6.8 7.2 6.6   Albumin 4.7 4.6 R 5.1 R 5.0 R 4.7 R   Globulin, Total 2.2 1.9 1.7 2.2 1.9   Bilirubin Total 0.5 0.4 0.9 0.4 1.0   Alkaline Phosphatase 60 59 61 54 CM 49 R   LDH 180 195 210 180 170   AST 22 24 24 14 20    ALT 22 20 17 10 19    GGT 17 12 14 12 14    Iron 82 87 182 High  66 94   Cholesterol, Total 143 134 135 139 120   Triglycerides 47 55 68 66 40   HDL 52 44 54 48 49   VLDL Cholesterol Cal 11 12 14 13 10    LDL Chol Calc (NIH) 80 78 67 78 61   Chol/HDL Ratio 2.8 3.0 CM 2.5 CM 2.9 CM 2.4 CM   Comment:                                   T. Chol/HDL Ratio                                             Men  Women                               1/2 Avg.Risk  3.4    3.3                                   Avg.Risk  5.0    4.4                                2X Avg.Risk  9.6    7.1  3X Avg.Risk 23.4   11.0  Estimated CHD Risk  < 0.5  < 0.5 CM  < 0.5 CM  < 0.5 CM  < 0.5 CM   Comment: The CHD Risk is based on the T. Chol/HDL ratio. Other factors affect CHD Risk such as hypertension, smoking, diabetes, severe obesity, and family history of premature CHD.  TSH 1.270 1.060 1.610 1.630 0.981   T4, Total 8.9 6.6 8.7 7.4 7.6   T3 Uptake Ratio 34 34 31 29 33   Free  Thyroxine Index 3.0 2.2 2.7 2.1 2.5   WBC 5.4 5.8 6.9 6.2 4.6 8.4 R  RBC 4.63 4.89 4.73 4.92 4.60 4.49 R  Hemoglobin 15.4 16.1 15.8 16.2 14.9   Hematocrit 45.1 47.3 43.6 46.3 42.8   MCV 97 97 92 94 93 96 R  MCH 33.3 High  32.9 33.4 High  32.9 32.4 32.3 R  MCHC 34.1 34.0 36.2 High  35.0 34.8 33.6 R  RDW 11.5 Low  11.6 10.4 Low  11.0 Low  12.0 11.6 R  Platelets 269 300 316 303 287 222 R  Neutrophils 53 60 60 46 51 71.2 R  Lymphs 35 30 30 42 36   Monocytes 7 6 7 7 8    Eos 3 3 2 3 3    Basos 1 1 1 1 1    Neutrophils Absolute 2.9 3.5 4.1 2.9 2.4 6.0 R  Lymphocytes Absolute 1.9 1.7 2.1 2.6 1.7 1.5 R  Monocytes Absolute 0.4 0.4 0.5 0.4 0.4   EOS (ABSOLUTE) 0.2 0.2 0.1 0.2 0.1   Basophils Absolute 0.1 0.0 0.1 0.0 0.0 0.0 R  Immature Granulocytes 1 0 0 1 1   Immature Grans (Abs) 0.0 0.0 0.0 0.1 0.0             ____________________________________________    ____________________________________________   INITIAL IMPRESSION / ASSESSMENT AND PLAN  As part of my medical decision making, I reviewed the following data within the electronic MEDICAL RECORD NUMBER       No acute findings on physical exam and labs.     ____________________________________________   FINAL CLINICAL IMPRESSION  Well exam   ED Discharge Orders     None        Note:  This document was prepared using Dragon voice recognition software and may include unintentional dictation errors.

## 2023-11-28 NOTE — Progress Notes (Signed)
 Pt presents today to complete physical, Pt denies any concerns at this time Gretel Acre

## 2024-11-26 ENCOUNTER — Encounter: Admitting: Physician Assistant
